# Patient Record
Sex: Female | Born: 2000 | Race: Black or African American | Hispanic: No | Marital: Single | Smoking: Never smoker
Health system: Southern US, Community
[De-identification: ages and names within clinical notes are randomized; demographics above are authoritative.]

## PROBLEM LIST (undated history)

## (undated) DIAGNOSIS — J45909 Unspecified asthma, uncomplicated: Secondary | ICD-10-CM

## (undated) DIAGNOSIS — L309 Dermatitis, unspecified: Secondary | ICD-10-CM

## (undated) HISTORY — PX: HERNIA REPAIR: SHX51

## (undated) HISTORY — PX: OTHER SURGICAL HISTORY: SHX169

---
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
## 2021-02-05 ENCOUNTER — Encounter: Payer: Medicaid Other | Attending: Pediatrics | Admitting: Registered"

## 2021-02-05 ENCOUNTER — Encounter: Payer: Self-pay | Admitting: Registered"

## 2021-02-05 DIAGNOSIS — R634 Abnormal weight loss: Secondary | ICD-10-CM | POA: Diagnosis not present

## 2021-02-05 NOTE — Progress Notes (Signed)
Medical Nutrition Therapy:  Appt start time: 1130 end time:  1212.  Assessment:  Primary concerns today: Pt referred due to weight loss.  Virtual Visit via Video Note  I connected with Tara Mckee on 02/05/21 at 11:30 AM EST by a video enabled telemedicine application and verified that I am speaking with the correct person using two identifiers.  Location: Patient: Dorm.  Provider: Office.   Pt reports primary concern is weight loss she has experienced since starting school this past year. Pt reports she doesn't think she has to gain the wt back but wants to gain back to previous wt. Reports last wt was 121 lb taken last month. Reports she feels she is eating the same amounts when she eats, however, reports eating less times each day. Pt reports this change in eating pattern is due to school related stress. Reports when stressed it reduces her appetite. Reports in past she would eat 3 meals and maybe 1 snack and now only eats 2 meals per day. Reports often skips breakfast. Reports sometimes does not eat if nothing in cafeteria she likes.   Pt attends [employer] in [place]. Pt has 8 AM classes 3 days per week and later starts other 2 days.   Pt denies changes in energy level, hair, nails. Denies any dizziness or headaches. Denies any GI issues. Pt reports having trouble sleeping, reports trouble falling asleep. Also reports not getting much sleep due to school work.   Pt reports depression. Reports having suicidal thoughts in past. Denies self harm or plans of self harm. Reports she talks with her mother if she has these thoughts. Pt reports her doctor offered a referral for counseling but she does not want to start counseling at this time. Reports she prefers to talk with her mother than a Veterinary surgeon.   Food Allergies/Intolerances: None reported.   GI Concerns: None reported.   Pertinent Lab Values: N/A  Weight Hx: See growth chart. Noted wt loss of 15 lb over past 10 months which  pt reports is unintentional.   Preferred Learning Style:   No preference indicated   Learning Readiness:   Ready  MEDICATIONS: See list. Reviewed.    DIETARY INTAKE:  Usual eating pattern includes 2 meals and 0 snacks per day.   Common foods: None reported.  Avoided foods: None reported.    Typical Snacks: chips.    Typical Beverages: Min[place] Maid fruit punch, soda, small amount water. Also likes chocolate milk. Pt likes yogurt, cheese only on hamburgers.   Location of Meals: dorm, cafeteria.   Electronics Present at [employer]: N/A  24-hr recall:  Woke at noon  B ( AM): Asleep  Snk ( AM): Asleep  L (12-1 PM): chips Snk ( PM): None reported.  D (4 PM): fries, philly cheese steak (cafeteria at school) Snk ( PM): None reported.  Beverages: Min[employer]id fruit punch, water   Usual physical activity: N/A Minutes/Week: N/A  Estimated energy needs (UBW used for calorie estimation: 136 lb):   1793 calories 202-291 g carbohydrates 53 g protein 50-70 g fat  Progress Towards Goal(s):  In progress.   Nutritional Diagnosis:  NI-1.4 Inadequate energy intake As related to skipping meals, increased stress/change in schedule, reduced appetite.  As evidenced by reported skipping meals since starting college; reported unintentional wt loss of 15 lb over past year.    Intervention:  Nutrition counseling provided. Dietitian provided education regarding importance of consistent eating pattern to meet nutritional needs and goals. Discussed strategies for  getting in 3 meals per day and 2 snacks to meet needs to help return to UBW. Discussed trying [employer] as snack 1 time daily and if unable to get a meal as plan B. Discussed whole food is preferred. Discussed adding multivitamin. Encouraged counseling to help with stress and reported depression. Praised pt for reaching out to mother when feeling depression. Provided number for suicide lifeline and discussed calling 911 if  ever having thoughts about hurting herself. Pt appeared agreeable to information/goals discussed.    Instructions/Goals:   Have 3 meals, 2 snacks per day.   Breakfast Ideas: protein + 2 energy foods  [place] yogurt + nuts + fruit   PB&J + fruit  Plan B if unable to get in solid foods: [employer] *May use as plan B if unable to get solid food at any meal  Snack Ideas:  Raw veggies + dip like ranch  Fruit + peanut butter  Greek yogurt   Recommend a complete multivitamin. May do Flintstones chewable Complete or Nature Made Complete.  National Suicide Prevention Lifeline: 1 6283412178 If you have harmed self or are thinking about harming yourself, call 911.  Recommend considering counseling services.   Teaching Method Utilized:  Visual Auditory  Barriers to learning/adherence to lifestyle change: stress   Demonstrated degree of understanding via:  Teach Back   Monitoring/Evaluation:  Dietary intake, exercise, and body weight in 1 month(s).

## 2021-02-06 NOTE — Patient Instructions (Signed)
Instructions/Goals:   Have 3 meals, 2 snacks per day.   Breakfast Ideas: protein + 2 energy foods  [place] yogurt + nuts + fruit   PB&J + fruit  Plan B if unable to get in solid foods: [employer] *May use as plan B if unable to get solid food at any meal  Snack Ideas:  Raw veggies + dip like ranch  Fruit + peanut butter  Greek yogurt   Recommend a complete multivitamin. May do Flintstones chewable Complete or Nature Made Complete.  National Suicide Prevention Lifeline: 1 220-084-8730 If you have harmed self or are thinking about harming yourself, call 911.  Recommend considering counseling services.

## 2021-04-05 ENCOUNTER — Encounter (INDEPENDENT_AMBULATORY_CARE_PROVIDER_SITE_OTHER): Payer: Self-pay

## 2021-04-15 DIAGNOSIS — R110 Nausea: Secondary | ICD-10-CM | POA: Diagnosis not present

## 2021-04-15 DIAGNOSIS — L0501 Pilonidal cyst with abscess: Secondary | ICD-10-CM | POA: Diagnosis not present

## 2021-04-23 ENCOUNTER — Encounter: Payer: Self-pay | Admitting: Pediatrics

## 2021-04-23 ENCOUNTER — Ambulatory Visit (INDEPENDENT_AMBULATORY_CARE_PROVIDER_SITE_OTHER): Payer: Medicaid Other | Admitting: Pediatrics

## 2021-04-23 ENCOUNTER — Other Ambulatory Visit (HOSPITAL_COMMUNITY)
Admission: RE | Admit: 2021-04-23 | Discharge: 2021-04-23 | Disposition: A | Discharge status: Home / Self Care | Payer: Medicaid Other | Source: Ambulatory Visit | Attending: Pediatrics | Admitting: Pediatrics

## 2021-04-23 VITALS — BP 100/68 | Ht 65.55 in | Wt 119.0 lb

## 2021-04-23 DIAGNOSIS — Z3202 Encounter for pregnancy test, result negative: Secondary | ICD-10-CM | POA: Diagnosis not present

## 2021-04-23 DIAGNOSIS — E639 Nutritional deficiency, unspecified: Secondary | ICD-10-CM

## 2021-04-23 DIAGNOSIS — Z113 Encounter for screening for infections with a predominantly sexual mode of transmission: Secondary | ICD-10-CM | POA: Insufficient documentation

## 2021-04-23 DIAGNOSIS — Z114 Encounter for screening for human immunodeficiency virus [HIV]: Secondary | ICD-10-CM

## 2021-04-23 DIAGNOSIS — L0231 Cutaneous abscess of buttock: Secondary | ICD-10-CM | POA: Diagnosis not present

## 2021-04-23 LAB — POCT RAPID HIV: Rapid HIV, POC: NEGATIVE

## 2021-04-23 LAB — POCT URINE PREGNANCY: Preg Test, Ur: NEGATIVE

## 2021-04-23 NOTE — Progress Notes (Signed)
Subjective:    Patient ID: Tara Mckee, female    DOB: [birth year], 20 y.o.   MRN: [id number]  HPI Tara Mckee is here to follow up after treatment for pilonidal abscess diagnosed 04/15/2021 at Urgent care.  1.  Record of visit and care is in EHR and reviewed by this physician.  Lesion was described as follows: "Location: Gluteal cleft 4 x 6 cm erythematous Lesion Fluctuant lesion warm to touch, tender to palpitation" Lesion ws drained in ED and Augmentin prescribed for a 76 day course. Tara Mckee states she has tolerated the medication well with no vomiting, diarrhea or vaginitis symptoms. States normal tolerance of activities but does get sore in that area if she sits a long time.  2.  Other concern today is sexual health. Last sexually active 1 month ago; has had 2 partners in the past 9 months of college and states she is no longer in a relationship. Condoms used but states recent partner's condom came off and she did not know it at the time.  No other form of contraception. Has had her regular menstrual period since the incident, LMP earlier in May and normal in character.  Tara Mckee is now enrolled at [employer]; states she left [employer] and [place] due to financial reasons. Grades are good. Reports not eating well because cafeteria has very limited selection and food is not good. Buys food off campus when she has extra money (not working now) and mom visits with frequency and takes her grocery shopping. 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  PMH, problem list, medications and allergies, family and social history reviewed and updated as indicated.  Review of Systems As noted in HPI above.    Objective:   Physical Exam Vitals and nursing note reviewed.  Constitutional:      General: She is not in acute distress.    Appearance: Normal appearance. She is normal weight. She is not ill-appearing.  Genitourinary:    Rectum:  Normal.     Comments: Palpation along gluteal cleft from base of sacrum to perineum with no pain or fluctuance.  No redness or abnormal findings. Neurological:     Mental Status: She is alert.    Blood pressure 100/68, height 5' 5.55" (1.665 m), weight 119 lb (54 kg). Wt Readings from Last 3 Encounters:  04/23/21 119 lb (54 kg) (36 %, Z= -0.36)*  12/29/20 120 lb 12.8 oz (54.8 kg) (41 %, Z= -0.22)*  06/14/20 132 lb 3.2 oz (60 kg) (65 %, Z= 0.39)*   * Growth percentiles are based on CDC (Girls, 2-20 Years) data.   Results for orders placed or performed in visit on 04/23/21 (from the past 48 hour(s))  POCT urine pregnancy     Status: Normal   Collection Time: 04/23/21  4:56 PM  Result Value Ref Range   Preg Test, Ur Negative Negative  POCT Rapid HIV     Status: Normal   Collection Time: 04/23/21  4:56 PM  Result Value Ref Range   Rapid HIV, POC Negative       Assessment & Plan:   1. Gluteal abscess Abscess is resolved on exam and by patient symptom report. Advised on hygiene and to call if symptoms return. She should complete her Augmentin as prescribed.  2. Negative pregnancy test Pregnancy test screening done due to lack of contraception.  Results were negative. Counseled on contraception - types, frequency of use and also used  Dealer. Tara Mckee voiced understanding of teaching but declined contraception today, stating she is not sexually active often and not ready to start contraception.  Provided printed information to review and reminded her she can call here or seek care local to her college if she decides to start contraception. - POCT urine pregnancy  3. Routine screening for STI (sexually transmitted infection) Screening done due to report of unreliable condom use. Informed her I will message her in MyChart with results. Condoms provided from office. - Urine cytology ancillary only - POCT Rapid HIV  4. Poor eating habits Offered some reassurance based on weight  stable these past months. Encouraged her to explore foods in cafeteria and eat BLD as much as possible; rely on supplemental foods she purchases herself. Should continue daily multivitamin with iron.  Greater than 50% of this 45 minute face to face encounter spent in counseling for presenting issues. Lurlean Leyden, MD

## 2021-04-23 NOTE — Patient Instructions (Addendum)
Abscess has resolved.  Finish the antibiotic and call if symptoms return  STI testing for chlamydia and gonorrhea  will be back on Tuesday or Wednesday. I will send you information in MyChart. Pregnancy test and HIV test NEGATIVE today.  Always use condom for STI protection. Let me know if you decide on contraception.  You can also get care at your school med center or local urgent care.  Take a daily multivitamin supplement like Women's One a Day  Contraception Choices Contraception refers to things you do or use to prevent pregnancy. It is also called birth control. There are several methods of birth control. Talk to your doctor about the best method for you. Hormonal birth control This kind of birth control uses hormones. Here are some types of hormonal birth control:  A tube that is put under the skin of your arm (implant). The tube can stay in for up to 3 years.  Shots you get every 3 months.  Pills you take every day.  A patch you change 1 time each week for 3 weeks. After that, the patch is taken off for 1 week.  A ring you put in the vagina. The ring is left in for 3 weeks. Then it is taken out of the vagina for 1 week. Then a new ring is put in.  Pills you take after unprotected sex. These are called emergency birth control pills.   Barrier birth control Here are some types of barrier birth control:  A thin covering that is put on the penis before sex (female condom). The covering is thrown away after sex.  A soft, loose covering that is put in the vagina before sex (female condom). The covering is thrown away after sex.  A rubber bowl that sits over the cervix (diaphragm). The bowl must be made for you. The bowl is put into the vagina before sex. The bowl is left in for 6-8 hours after sex. It is taken out within 24 hours.  A small, soft cup that fits over the cervix (cervical cap). The cup must be made for you. The cup should be left in for 6-8 hours after sex. It is taken  out within 48 hours.  A sponge that is put into the vagina before sex. It must be left in for at least 6 hours after sex. It must be taken out within 30 hours and thrown away.  A chemical that kills or stops sperm from getting into the womb (uterus). This chemical is called a spermicide. It may be a pill, cream, jelly, or foam to put in the vagina. The chemical should be used at least 10-15 minutes before sex.   IUD birth control IUD means "intrauterine device." It is put inside the womb. There are two kinds:  Hormone IUD. This kind can stay in the womb for 3-5 years.  Copper IUD. This kind can stay in the womb for 10 years. Permanent birth control Here are some types of permanent birth control:  Surgery to block the fallopian tubes.  Having an insert put into each fallopian tube. This method takes 3 months to work. Other forms of birth control must be used for 3 months.  Surgery to tie off the tubes that carry sperm in men (vasectomy). This method takes 3 months to work. Other forms of birth control must be used for 3 months. Natural planning birth control Here are some types of natural planning birth control:  Not having sex on the days  the woman could get pregnant.  Using a calendar: ? To keep track of the length of each menstrual cycle. ? To find out what days pregnancy can happen. ? To plan to not have sex on days when pregnancy can happen.  Watching for signs of ovulation and not having sex during this time. One way the woman can check for ovulation is to check her temperature.  Waiting to have sex until after ovulation. Where to find more information  Centers for Disease Control and Prevention: FootballExhibition.com.br Summary  Contraception, also called birth control, refers to things you do or use to prevent pregnancy.  Hormonal methods of birth control include implants, injections, pills, patches, vaginal rings, and emergency birth control pills.  Barrier methods of birth  control can include female condoms, female condoms, diaphragms, cervical caps, sponges, and spermicides.  There are two types of IUD (intrauterine device) birth control. An IUD can be put in a woman's womb to prevent pregnancy for several years.  Permanent birth control can be done through a procedure for males, females, or both. Natural planning means not having sex when the woman could get pregnant. This information is not intended to replace advice given to you by your health care provider. Make sure you discuss any questions you have with your health care provider. Document Revised: 04/24/2020 Document Reviewed: 04/24/2020 Elsevier Patient Education  2021 [employer].

## 2021-04-26 ENCOUNTER — Other Ambulatory Visit: Payer: Self-pay | Admitting: Pediatrics

## 2021-04-26 DIAGNOSIS — A5602 Chlamydial vulvovaginitis: Secondary | ICD-10-CM

## 2021-04-26 DIAGNOSIS — A5609 Other chlamydial infection of lower genitourinary tract: Secondary | ICD-10-CM

## 2021-04-26 DIAGNOSIS — N760 Acute vaginitis: Secondary | ICD-10-CM

## 2021-04-26 LAB — URINE CYTOLOGY ANCILLARY ONLY
Chlamydia: POSITIVE — AB
Comment: NEGATIVE
Comment: NORMAL
Neisseria Gonorrhea: NEGATIVE

## 2021-04-26 MED ORDER — DOXYCYCLINE HYCLATE 100 MG PO CAPS: ORAL_CAPSULE | ORAL | 0 refills | Status: DC

## 2021-04-26 MED ORDER — DOXYCYCLINE MONOHYDRATE 100 MG PO TABS: ORAL_TABLET | ORAL | 0 refills | Status: DC

## 2021-04-26 NOTE — Telephone Encounter (Signed)
I called and spoke with patient about test results and reporting. Answered her questions.  Elley states unable to contact her ex-partner. Will send prescription to her preferred pharmacy.  Doxycycline 100 mg bid x 7 days; follow up only if needed. Reporting form placed for faxing.

## 2021-04-26 NOTE — Telephone Encounter (Signed)
Changed to capsule.

## 2021-05-31 ENCOUNTER — Ambulatory Visit: Payer: Medicaid Other | Admitting: Pediatrics

## 2021-07-02 ENCOUNTER — Ambulatory Visit: Payer: Medicaid Other | Admitting: Pediatrics

## 2021-07-05 ENCOUNTER — Telehealth: Payer: Self-pay | Admitting: Licensed Clinical Social Worker

## 2021-07-05 ENCOUNTER — Ambulatory Visit (INDEPENDENT_AMBULATORY_CARE_PROVIDER_SITE_OTHER): Payer: Medicaid Other | Admitting: Pediatrics

## 2021-07-05 ENCOUNTER — Other Ambulatory Visit: Payer: Self-pay

## 2021-07-05 ENCOUNTER — Encounter: Payer: Self-pay | Admitting: Pediatrics

## 2021-07-05 ENCOUNTER — Other Ambulatory Visit (HOSPITAL_COMMUNITY)
Admission: RE | Admit: 2021-07-05 | Discharge: 2021-07-05 | Disposition: A | Discharge status: Home / Self Care | Payer: Medicaid Other | Source: Ambulatory Visit | Attending: Pediatrics | Admitting: Pediatrics

## 2021-07-05 VITALS — Wt 116.2 lb

## 2021-07-05 DIAGNOSIS — R4589 Other symptoms and signs involving emotional state: Secondary | ICD-10-CM | POA: Diagnosis not present

## 2021-07-05 DIAGNOSIS — R634 Abnormal weight loss: Secondary | ICD-10-CM | POA: Diagnosis not present

## 2021-07-05 DIAGNOSIS — R300 Dysuria: Secondary | ICD-10-CM

## 2021-07-05 DIAGNOSIS — G479 Sleep disorder, unspecified: Secondary | ICD-10-CM | POA: Diagnosis not present

## 2021-07-05 LAB — POCT URINALYSIS DIPSTICK
Bilirubin, UA: NEGATIVE
Glucose, UA: NEGATIVE
Ketones, UA: NEGATIVE
Leukocytes, UA: NEGATIVE
Nitrite, UA: NEGATIVE
Protein, UA: POSITIVE — AB
Spec Grav, UA: >=1.03 — AB (ref 1.010–1.025)
Urobilinogen, UA: 0.2 E.U./dL
pH, UA: 5 (ref 5.0–8.0)

## 2021-07-05 NOTE — Patient Instructions (Signed)
Websites for Teens  General www.youngwomenshealth.org www.youngmenshealthsite.org www.teenhealthfx.com www.teenhealth.org www.healthychildren.org  Sexual and Reproductive Health www.bedsider.org www.seventeendays.org www.plannedparenthood.org www.StrengthHappens.si www.girlology.com  Relaxation & Meditation Apps for Teens Mindshift StopBreatheThink Relax & Rest Smiling Mind Calm Headspace Take A Chill Kids Feeling SAM Freshmind Yoga By [employer]  Websites for kids with ADHD and their families www.smartkidswithld.org www.additudemag.com  Teens need about 9 hours of sleep a night. Younger children need more sleep (10-11 hours a night) and adults need slightly less (7-9 hours each night). 11 Tips to Follow: No caffeine after 3pm: Avoid beverages with caffeine (soda, tea, energy drinks, etc.) especially after 3pm.  Don't go to bed hungry: Have your evening meal at least 3 hrs. before going to sleep. It's fine to have a small bedtime snack such as a glass of milk and a few crackers but don't have a big meal.  Have a nightly routine before bed: Plan on "winding down" before you go to sleep. Begin relaxing about 1 hour before you go to bed. Try doing a quiet activity such as listening to calming music, reading a book or meditating.  Turn off the TV and ALL electronics including video games, tablets, laptops, etc. 1 hour before sleep, and keep them out of the bedroom.  Turn off your cell phone and all notifications (new email and text alerts) or even better, leave your phone outside your room while you sleep. Studies have shown that a part of your brain continues to respond to certain lights and sounds even while you're still asleep.  Make your bedroom quiet, dark and cool. If you can't control the noise, try wearing earplugs or using a fan to block out other sounds.  Practice relaxation techniques. Try reading a book or meditating or drain your brain by writing a list of  what you need to do the next day.  Don't nap unless you feel sick: you'll have a better night's sleep.  Don't smoke, or quit if you do. Nicotine, alcohol, and marijuana can all keep you awake. Talk to your health care provider if you need help with substance use.  Most importantly, wake up at the same time every day (or within 1 hour of your usual wake up time) EVEN on the weekends. A regular wake up time promotes sleep hygiene and prevents sleep problems.  Reduce exposure to bright light in the last three hours of the day before going to sleep.  Maintaining good sleep hygiene and having good sleep habits lower your risk of developing sleep problems. Getting better sleep can also improve your concentration and alertness. Try the simple steps in this guide. If you still have trouble getting enough rest, make an appointment with your health care provider.

## 2021-07-05 NOTE — Telephone Encounter (Signed)
Called to schedule bh appointment. Left voicemail with contact information and requested call back to [phone]

## 2021-07-05 NOTE — Progress Notes (Signed)
History was provided by the patient.  Personal phone number: [phone]  Tara Mckee is a 20 y.o. female who is here for follow-up of multiple issues including dysuria, weight loss, sleep, and mood.   HPI:  weight loss: 132->116 in 1 year; was 120 lb in January 2022 to 116 lb today) feels like she is eating a lot but still losing weight. Eats breakfast at cafeteria. Lunch at cafeteria or out and about.  - Breakfast: 3 times weekly  - Lunch: daily at cafeteria  - Dinner: daily or at [employer]  - has meal plan; in her room keeps mostly junk food (chips); tries yogurt but doesn't eat them  2. dysuria - since taking the medicine for chlamydia (04/26/21), had return of burning sensation with urination, increased odor. No discharge, no blood. Has not been sexually active since. No rash or lesions.  3. Sleep and mood concerns: - sleep poor: takes hours to fall asleep and needs "Sleep Aid" or Benadryl; over-thinking - At college at [employer]: drama, had stopped going to class, feeling sad daily, unable to get up to class - Mood: rough summer, mom w/health problems (seizure disorder) worsening. Feels sad every day, impacts ability to participate in daily life, feelings of guilt, poor sleep appetite loss/weight loss. Multiple times weekly thoughts of not wanting to be around. Endorses suicide intent "tried to overdose on Sleep Aid" in effort "not to be around" back in march 2022. Doesn't have anyone to talk to as she doesn't want to burden mom, was close to grandmother who passed away 1.5 years ago. No other close family or friends. No current plan to harm self, no current thoughts of self harm. Confirmed she has [facility] number to call if mood worsening, or go to ED/Urgent Care. Warm hand-ff to [facility] conducted by Dr. Duffy Rhody.   The following portions of the patient's history were reviewed and updated as appropriate: allergies, current medications, past medical history, and problem  list.  Physical Exam:  Wt 116 lb 3.2 oz (52.7 kg)   BMI 19.01 kg/m   Blood pressure percentiles are not available for patients who are 18 years or older.  No LMP recorded.    General:   alert, cooperative, and no distress     Skin:   normal and no rash  Oral cavity:   lips, mucosa, and tongue normal; teeth and gums normal  Eyes:   sclerae white  Ears:    Normal external ears  Nose: clear, no discharge  Neck:  Supple, no cervical adenopathy  Lungs:  clear to auscultation bilaterally and normal WOB  Heart:   regular rate and rhythm, S1, S2 normal, no murmur, click, rub or gallop   Abdomen:   Soft, non-tender, non-distended, +BS  GU:   Declined. Patient denies discharge or lesions  Extremities:   extremities normal, atraumatic, no cyanosis or edema  Neuro:  normal without focal findings and depressed affect    Assessment/Plan:  19 year old in follow-up for dysuria, found to have continued weight loss and sleep/mood concerns concerning for depression with admitted suicidal attempt months ago (March 2022). No acute danger to self or others, and warm hand-off to [facility] today. She will benefit from mental health support as family is going through a challenging time.  1. Dysuria - POCT urinalysis dipstick: neg leuk/nitrite - Urine cytology ancillary only: pending  2. Sleep difficulties - Amb ref to [facility]  3. Depressed mood secondary to family circumstance - Amb ref to  [facility], warm hand-off today - safe to self and others currently but will benefit from coping strategies/support - Follow-up with [facility] set for 8/19  4. Weight loss due to inadequate caloric intake - likely secondary to the above - no other systemic symptoms  - no food insecurity (has meal plan at college, plenty of food at home) - discussed breakfast and snacks on hand with protein - peanut butter, protein bars, yogurt, nuts, hard-boiled eggs, etc.  Follow-up visit in 2  weeks joint with PCP Duffy Rhody and [facility]   Marita [place], MD  07/05/21

## 2021-07-06 LAB — URINE CYTOLOGY ANCILLARY ONLY
Chlamydia: NEGATIVE
Comment: NEGATIVE
Comment: NORMAL
Neisseria Gonorrhea: NEGATIVE

## 2021-07-19 ENCOUNTER — Ambulatory Visit: Payer: Medicaid Other | Admitting: Pediatrics

## 2021-07-20 ENCOUNTER — Ambulatory Visit: Payer: Medicaid Other | Admitting: Licensed Clinical Social Worker

## 2021-07-20 NOTE — BH Specialist Note (Signed)
Connected to appointment and texted link. Called patient to offer assistance with logging on. Patient asked to reschedule appointment. BH consult rescheduled for 9/8 at 4:30 pm virtually.

## 2021-08-09 ENCOUNTER — Ambulatory Visit: Payer: Medicaid Other | Admitting: Licensed Clinical Social Worker

## 2021-08-09 NOTE — BH Specialist Note (Signed)
No show for virtual appointment. Sent link and remained connected to visit for 16 minutes. LVM for patient requesting call back to [phone] for help connecting or to reschedule.

## 2021-08-16 ENCOUNTER — Ambulatory Visit: Payer: Medicaid Other | Admitting: Pediatrics

## 2021-12-05 ENCOUNTER — Ambulatory Visit: Payer: Medicaid Other | Admitting: Pediatrics

## 2022-02-14 ENCOUNTER — Ambulatory Visit (INDEPENDENT_AMBULATORY_CARE_PROVIDER_SITE_OTHER): Payer: Medicaid Other | Admitting: Pediatrics

## 2022-02-14 ENCOUNTER — Other Ambulatory Visit (HOSPITAL_COMMUNITY)
Admission: RE | Admit: 2022-02-14 | Discharge: 2022-02-14 | Disposition: A | Discharge status: Home / Self Care | Payer: Medicaid Other | Source: Ambulatory Visit | Attending: Pediatrics | Admitting: Pediatrics

## 2022-02-14 VITALS — BP 116/74 | Ht 66.14 in | Wt 121.2 lb

## 2022-02-14 DIAGNOSIS — Z113 Encounter for screening for infections with a predominantly sexual mode of transmission: Secondary | ICD-10-CM | POA: Insufficient documentation

## 2022-02-14 DIAGNOSIS — L2082 Flexural eczema: Secondary | ICD-10-CM

## 2022-02-14 MED ORDER — TRIAMCINOLONE ACETONIDE 0.1 % EX CREA: TOPICAL_CREAM | CUTANEOUS | 2 refills | Status: DC

## 2022-02-14 NOTE — Patient Instructions (Signed)
I will call you with the test results or send to you in MyChart. ?

## 2022-02-14 NOTE — Progress Notes (Signed)
? ?  Subjective:  ? ? Patient ID: Tara Mckee, female    DOB: 2001-01-14, 21 y.o.   MRN: [id number] ? ?HPI ?Chief Complaint  ?Patient presents with  ? Follow-up  ?  ?Tara Mckee is here with several concerns today.  She is accompanied by a friend from college. ?States problem with eczema, especially arms and legs.  Using moisturizer and would like refill of her steroid cream. ?Some nasal symptoms related to seasonal allergies. ? ?2.  States her urine has a different smell and would like this checked.  No vaginal discharge or itching noted. ?No menstrual problems. ?Last sex with female 1 month ago and only 1 partner in 6 months ? ?Otherwise doing well. ? ?PMH, problem list, medications and allergies, family and social history reviewed and updated as indicated.  ? ?Review of Systems ?As noted in HPI above. ?   ?Objective:  ? Physical Exam ?Vitals and nursing note reviewed.  ?Constitutional:   ?   General: She is not in acute distress. ?   Appearance: Normal appearance. She is normal weight.  ?Cardiovascular:  ?   Rate and Rhythm: Normal rate and regular rhythm.  ?   Pulses: Normal pulses.  ?   Heart sounds: Normal heart sounds.  ?Pulmonary:  ?   Effort: Pulmonary effort is normal.  ?   Breath sounds: Normal breath sounds.  ?Abdominal:  ?   Palpations: Abdomen is soft.  ?   Tenderness: There is no abdominal tenderness.  ?Skin: ?   General: Skin is warm and dry.  ?   Capillary Refill: Capillary refill takes less than 2 seconds.  ?   Comments: Hyperpigmented eczematoid change at antecubital and popliteal fossae; no breaks in skin or molluscum  ?Neurological:  ?   General: No focal deficit present.  ?   Mental Status: She is alert.  ?Psychiatric:     ?   Mood and Affect: Mood normal.     ?   Behavior: Behavior normal.  ? ?Blood pressure 116/74, height 5' 6.14" (1.68 m), weight 121 lb 3.2 oz (55 kg).  ?   ?Assessment & Plan:  ?1. Flexural eczema ?Chronic, recurring problem. ?Advised on continued use of mild cleansers and moisturizer.   Sent refill of triamcinolone to her preferred pharmacy. ?- triamcinolone cream (KENALOG) 0.1 %; Apply to areas of eczema on body twice a day when needed; do not use on face  Dispense: 45 g; Refill: 2 ? ?2. Routine screening for STI (sexually transmitted infection) ?Discussed screening sent for STI and vaginitis.  Will contact patient with results and treat as indicate. ?- Urine cytology ancillary only ?- WET PREP BY MOLECULAR PROBE  ? ?Azalynn voiced understanding and agreement with plan of care. ?Maree Erie, MD  ? ?Addendum:  labs resulted with G vaginalis noted.  Discussed with patient and treatment sent to her pharmacy on 02/15/2022. ?Maree Erie, MD  ?

## 2022-02-15 ENCOUNTER — Encounter: Payer: Self-pay | Admitting: Pediatrics

## 2022-02-15 DIAGNOSIS — B9689 Other specified bacterial agents as the cause of diseases classified elsewhere: Secondary | ICD-10-CM

## 2022-02-15 LAB — WET PREP BY MOLECULAR PROBE
Candida species: NOT DETECTED
MICRO NUMBER:: 13140832
SPECIMEN QUALITY:: ADEQUATE
Trichomonas vaginosis: NOT DETECTED

## 2022-02-15 LAB — URINE CYTOLOGY ANCILLARY ONLY
Chlamydia: NEGATIVE
Comment: NEGATIVE
Comment: NORMAL
Neisseria Gonorrhea: NEGATIVE

## 2022-02-15 MED ORDER — METRONIDAZOLE 500 MG PO TABS: ORAL_TABLET | ORAL | 0 refills | Status: DC

## 2022-02-18 ENCOUNTER — Encounter: Payer: Self-pay | Admitting: Pediatrics

## 2022-02-28 ENCOUNTER — Encounter: Payer: Self-pay | Admitting: Pediatrics

## 2022-03-04 ENCOUNTER — Ambulatory Visit (INDEPENDENT_AMBULATORY_CARE_PROVIDER_SITE_OTHER): Payer: Medicaid Other | Admitting: Pediatrics

## 2022-03-04 ENCOUNTER — Encounter: Payer: Self-pay | Admitting: Pediatrics

## 2022-03-04 ENCOUNTER — Other Ambulatory Visit (HOSPITAL_COMMUNITY)
Admission: RE | Admit: 2022-03-04 | Discharge: 2022-03-04 | Disposition: A | Discharge status: Home / Self Care | Payer: Medicaid Other | Source: Ambulatory Visit | Attending: Pediatrics | Admitting: Pediatrics

## 2022-03-04 VITALS — Temp 98.1°F | Wt 121.2 lb

## 2022-03-04 DIAGNOSIS — Z3202 Encounter for pregnancy test, result negative: Secondary | ICD-10-CM | POA: Diagnosis not present

## 2022-03-04 DIAGNOSIS — N898 Other specified noninflammatory disorders of vagina: Secondary | ICD-10-CM

## 2022-03-04 DIAGNOSIS — Z113 Encounter for screening for infections with a predominantly sexual mode of transmission: Secondary | ICD-10-CM | POA: Diagnosis not present

## 2022-03-04 DIAGNOSIS — B3731 Acute candidiasis of vulva and vagina: Secondary | ICD-10-CM

## 2022-03-04 DIAGNOSIS — Z7251 High risk heterosexual behavior: Secondary | ICD-10-CM | POA: Insufficient documentation

## 2022-03-04 LAB — POCT URINALYSIS DIPSTICK
Bilirubin, UA: NEGATIVE
Blood, UA: 50
Glucose, UA: NEGATIVE
Ketones, UA: NEGATIVE
Nitrite, UA: POSITIVE
Protein, UA: POSITIVE — AB
Spec Grav, UA: 1.01 (ref 1.010–1.025)
Urobilinogen, UA: NEGATIVE E.U./dL — AB
pH, UA: 8.5 — AB (ref 5.0–8.0)

## 2022-03-04 LAB — POCT URINE PREGNANCY: Preg Test, Ur: NEGATIVE

## 2022-03-04 MED ORDER — FLUCONAZOLE 150 MG PO TABS: ORAL_TABLET | ORAL | 0 refills | Status: DC

## 2022-03-04 NOTE — Patient Instructions (Signed)
I have send a prescription for diflucan to your pharmacy to treat for yeast infection. You will take the first dose today. If your symptoms are not improved in 2-3 days then take the second dose. ? ?We will call if results of your testing are positive. If your symptoms do not improve with treatment, please call our office. ? ? ?Call the main number [phone] before going to the Emergency Department unless it's a true emergency.  For a true emergency, go to the [facility] Emergency Department.  ? ?When the clinic is closed, a nurse always answers the main number [phone] and a doctor is always available. ?   ?Clinic is open for sick visits only on Saturday mornings from 8:30AM to 12:30PM.   Call first thing on Saturday morning for an appointment.   ?

## 2022-03-04 NOTE — Progress Notes (Signed)
? ?Subjective:  ? ?  ?Tara Mckee, is a 21 y.o. female ?  ?History provider by patient ?No interpreter necessary. ? ?Chief Complaint  ?Patient presents with  ? vaginal issues  ?  She said she is had intercourse after medication.  ? ? ?HPI:  ?- Seen in the office on 3/16 and wet prep was positive for G vaginalis. Treated with metronidazole starting 3/17. Completed a full two week course.  ?- Patient reports that the abnormal urine smell at previous visit resolved after treatment ?- She had intercourse after treatment- developed soreness and felt swollen about a day later. She is still having some pain with urination. ?- Used protection with intercourse  ?- She is now having vaginal discharge that is white with some itching ?- Denies abnormal smell, urinary frequency or urgency, dysuria, fevers, abdominal pain ?- She is not on birth control but regularly uses condoms ? ? ?Patient's history was reviewed and updated as appropriate: allergies, current medications, past family history, past medical history, past social history, past surgical history, and problem list. ? ?   ?Objective:  ?  ? ?Temp 98.1 ?F (36.7 ?C) (Oral)   Wt 121 lb 3.2 oz (55 kg)   BMI 19.48 kg/m?  ? ?Physical Exam ?Exam conducted with a chaperone present.  ?Constitutional:   ?   General: She is not in acute distress. ?   Appearance: Normal appearance. She is normal weight.  ?HENT:  ?   Head: Normocephalic and atraumatic.  ?   Mouth/Throat:  ?   Mouth: Mucous membranes are moist.  ?   Pharynx: Oropharynx is clear.  ?Eyes:  ?   Extraocular Movements: Extraocular movements intact.  ?Genitourinary: ?   General: Normal vulva.  ?   Comments: No erythema or discharge appreciated on external exam. No vaginal tenderness appreciated on exam. ?Skin: ?   General: Skin is warm and dry.  ?Neurological:  ?   General: No focal deficit present.  ?   Mental Status: She is alert.  ?Psychiatric:     ?   Mood and Affect: Mood normal.     ?   Behavior: Behavior normal.   ? ? ?   ?Assessment & Plan:  ? ?1. Yeast infection of the vagina ?2. Vaginal discharge ?3. Sexually active child ?4. Routine screening for STI (sexually transmitted infection) ?Tara Mckee presents for vaginal discharge and itching. She was recently seen and treated for bacterial vaginosis which she completed treatment for with resolution of symptoms. She has also previously had chlamydia infection. Today she describes new onset of clumpy white vaginal discharge with itching. Denies dysuria, fevers, abdominal pain. Her exam is unremarkable without vaginal discharge or tenderness appreciated. Vaginal exam with wet prep swab was completed. She has been using condoms regularly with intercourse. Wet prep, UA, upreg, and gc/chlamydia testing obtained. Her symptoms are most consistent with a vaginal yeast infection. We will empirically treat with diflucan and follow up test results. Pregnancy test negative. UA was positive but it was a dirty collection. She is not having symptoms consistent with a UTI so will not treat at this but will recollect if continues to have symptoms after treatment. Will follow up with any positive test results. We also discussed birth control options. She is interested in depo but would like it cause weight gain. She will call if she decides upon a birth control option. ?- fluconazole (DIFLUCAN) 150 MG tablet; Take one tablet and if symptoms not better in 2-3 days take the  second tablet  Dispense: 2 tablet; Refill: 0 ?- C. trachomatis/N. gonorrhoeae RNA ?- POCT urinalysis dipstick ?- WET PREP BY MOLECULAR PROBE ?- POCT urine pregnancy ? ?Supportive care and return precautions reviewed. ? ?Return if symptoms worsen or fail to improve. ? ?Tara Hickman, MD ? ? ? ?

## 2022-03-05 LAB — URINE CYTOLOGY ANCILLARY ONLY
Chlamydia: NEGATIVE
Comment: NEGATIVE
Comment: NORMAL
Neisseria Gonorrhea: NEGATIVE

## 2022-03-05 LAB — WET PREP BY MOLECULAR PROBE
Candida species: DETECTED — AB
Gardnerella vaginalis: NOT DETECTED
MICRO NUMBER:: 13215447
SPECIMEN QUALITY:: ADEQUATE
Trichomonas vaginosis: NOT DETECTED

## 2022-03-05 NOTE — Progress Notes (Signed)
Patient has already viewed her test results this afternoon via mychart. Sent mychart message to patient regarding results. She was prescribed appropriate treatment for candida in clinic yesterday.

## 2022-05-22 ENCOUNTER — Ambulatory Visit: Payer: Medicaid Other | Admitting: Pediatrics

## 2022-06-05 ENCOUNTER — Ambulatory Visit (INDEPENDENT_AMBULATORY_CARE_PROVIDER_SITE_OTHER): Payer: Medicaid Other | Admitting: Pediatrics

## 2022-06-05 ENCOUNTER — Other Ambulatory Visit (HOSPITAL_COMMUNITY)
Admission: RE | Admit: 2022-06-05 | Discharge: 2022-06-05 | Disposition: A | Discharge status: Home / Self Care | Payer: Medicaid Other | Source: Ambulatory Visit | Attending: Pediatrics | Admitting: Pediatrics

## 2022-06-05 ENCOUNTER — Encounter: Payer: Self-pay | Admitting: Pediatrics

## 2022-06-05 VITALS — BP 102/70 | HR 96 | Ht 65.55 in | Wt 123.2 lb

## 2022-06-05 DIAGNOSIS — Z114 Encounter for screening for human immunodeficiency virus [HIV]: Secondary | ICD-10-CM

## 2022-06-05 DIAGNOSIS — M549 Dorsalgia, unspecified: Secondary | ICD-10-CM

## 2022-06-05 DIAGNOSIS — G8929 Other chronic pain: Secondary | ICD-10-CM

## 2022-06-05 DIAGNOSIS — Z7187 Encounter for pediatric-to-adult transition counseling: Secondary | ICD-10-CM

## 2022-06-05 DIAGNOSIS — L309 Dermatitis, unspecified: Secondary | ICD-10-CM

## 2022-06-05 DIAGNOSIS — F419 Anxiety disorder, unspecified: Secondary | ICD-10-CM | POA: Diagnosis not present

## 2022-06-05 DIAGNOSIS — Z3202 Encounter for pregnancy test, result negative: Secondary | ICD-10-CM | POA: Diagnosis not present

## 2022-06-05 DIAGNOSIS — Z113 Encounter for screening for infections with a predominantly sexual mode of transmission: Secondary | ICD-10-CM

## 2022-06-05 DIAGNOSIS — Z3042 Encounter for surveillance of injectable contraceptive: Secondary | ICD-10-CM | POA: Diagnosis not present

## 2022-06-05 DIAGNOSIS — Z68.41 Body mass index (BMI) pediatric, 5th percentile to less than 85th percentile for age: Secondary | ICD-10-CM | POA: Diagnosis not present

## 2022-06-05 DIAGNOSIS — Z0000 Encounter for general adult medical examination without abnormal findings: Secondary | ICD-10-CM | POA: Diagnosis not present

## 2022-06-05 DIAGNOSIS — E639 Nutritional deficiency, unspecified: Secondary | ICD-10-CM | POA: Diagnosis not present

## 2022-06-05 LAB — POCT RAPID HIV: Rapid HIV, POC: NEGATIVE

## 2022-06-05 LAB — POCT URINE PREGNANCY: Preg Test, Ur: NEGATIVE

## 2022-06-05 MED ORDER — MEDROXYPROGESTERONE ACETATE 150 MG/ML IM SUSP
150.0000 mg | Freq: Once | INTRAMUSCULAR | Status: AC
  Administered 2022-06-05: 150 mg via INTRAMUSCULAR

## 2022-06-05 NOTE — Progress Notes (Signed)
Adolescent Well Care Visit Tara Mckee is a 21 y.o. female who is here for well care.    PCP:  Maree Erie, MD   History was provided by the patient.  Confidentiality was discussed with the patient and, if applicable, with caregiver as well. Patient's personal or confidential phone number: [phone]   Current Issues: Current concerns include the following: Would like to see dermatologist again about rash around her mouth; licks her lips and area gets dry and irritated, leading to more licking..   2.   Still has some urinary changes - feels incomplete emptying or frequency since STI but no other problems.       Uses [employer] for cleansing and showers. 3.   Concerned she needs help maintaining healthy weight. 4.   Low back pain on both sides.  Does not remember any injury and not doing heavy lifting.  No meds or other        current modifying factors.  Nutrition: Nutrition/Eating Behaviors: not eating as well since working - snacks (chips, cookies) and then dinner Adequate calcium in diet?: cheese and yogurt Supplements/ Vitamins: One a day women's  Exercise/ Media: Play any Sports?/ Exercise: walking at work over her  4 to 5 hours shift - this is an improvement Screen Time:  > 2 hours-counseling provided Media Rules or Monitoring?: no  Sleep:  Sleep: Not sleeping well.  Asleep around midnight then up 10 /11 am; wakes up during the night and not able to get back to sleep for a while.  New job will require her to sleep during the day and she thinks she will adjust to this fine. 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  no Stressors of note: university life was stressful, so she decided to move back home  Education: School Name: plans to start at [employer]  or other local school  Completed 4 semesters of credit at [employer] level   Menstruation:   Patient's last menstrual period was 05/28/2022 (approximate). Menstrual History: LMP 1 week ago and lasted 4 to 5 days Sexually active last in May/June - no longer with that partner   Confidential Social History: Tobacco?  Vapes daily Secondhand smoke exposure?  yes Drugs/ETOH?  no  Sexually Active?  yes   Pregnancy Prevention: condom  Safe at home, in school & in relationships?  Yes Safe to self?  Yes   Screenings: Patient has a dental home: no - last went for routine dental care a couple of years ago Has orthodontist and went for visit a couple of months ago; should have braces off soon.  The patient completed the Rapid Assessment for Adolescent Preventive Services screening questionnaire and the following topics were identified as risk factors and discussed: vaping, sexual activity. In addition, the following topics were discussed as part of anticipatory guidance healthy eating, condom use, birth control, mental health issues, and sleep .  PHQ-9 completed and results indicated low risk with score of 3 for sleep; no self-harm ideation.  The patient completed the Transition Skills Assessment for Young Adults screening questionnaire and the following topics were identified as learning needs and discussed:  no gaps in learning noted except how to transition to adult medicine and how to get referrals.   Physical Exam:  Vitals:   06/05/22 1554  BP: 102/70  Pulse: 96  SpO2: 99%  Weight: 123 lb 3.2 oz (55.9 kg)  Height: 5' 5.55" (1.665 m)   Wt Readings from Last 3 Encounters:  06/05/22 123 lb 3.2 oz (55.9 kg) (40 %, Z= -0.25)*  03/04/22 121 lb 3.2 oz (55 kg) (37 %, Z= -0.34)*  02/14/22 121 lb 3.2 oz (55 kg) (37 %, Z= -0.33)*   * Growth percentiles are based on CDC (Girls, 2-20 Years) data.    BP 102/70 (BP Location: Right Arm, Patient Position: Sitting, Cuff Size: Normal)   Pulse 96    Ht 5' 5.55" (1.665 m)   Wt 123 lb 3.2 oz (55.9 kg)   LMP 05/28/2022 (Approximate)   SpO2 99%   BMI 20.16 kg/m  Body mass index: body mass index is 20.16 kg/m. Blood pressure %iles are not available for patients who are 18 years or older.  Hearing Screening  Method: Audiometry   500Hz  1000Hz  2000Hz  4000Hz   Right ear 20 20 20 20   Left ear 20 20 20 20    Vision Screening   Right eye Left eye Both eyes  Without correction 20/16 20/16 20/16   With correction       General Appearance:   alert, oriented, no acute distress and well nourished  HENT: Normocephalic, no obvious abnormality, conjunctiva clear  Mouth:   Normal appearing teeth, no obvious discoloration, dental caries, or dental caps  Neck:   Supple; thyroid: no enlargement, symmetric, no tenderness/mass/nodules  Chest Normal female  Lungs:   Clear to auscultation bilaterally, normal work of breathing  Heart:   Regular rate and rhythm, S1 and S2 normal, no murmurs;   Abdomen:   Soft, non-tender, no mass, or organomegaly  GU normal female external genitalia, pelvic not performed.  Thin mucoid discharge  Musculoskeletal:   Tone and strength strong and symmetrical, all extremities               Lymphatic:   No cervical adenopathy  Skin/Hair/Nails:   Skin warm, dry and intact, no rashes, no bruises or petechiae.  Tattoo to right and left (fresh today) sides of neck  Neurologic:   Strength, gait, and coordination normal and age-appropriate     Assessment and Plan:   1. Encounter for general adult medical examination without abnormal findings Overall healthy young female. Hearing screening result:normal Vision screening result: normal Age appropriate anticipatory guidance provided.  2. Body mass index, pediatric, 5th percentile to less than 85th percentile for age BMI is appropriate for age; reviewed with Vitalia and encouraged more healthful eating habits.  3. Screening examination for venereal disease Routine screening  for HIV and STI; all returned negative. - POCT Rapid HIV - Urine cytology ancillary only  4. Poor eating habits Counseled on improved eating habits for adequate nutrients.  Advised to continue her supplement and referred to nutrition for guidance as she further gains responsibility for her own meals. - Amb ref to Medical Nutrition Therapy-MNT  5. Counseling for transition from pediatric to adult care provider Armanie demonstrated appropriate knowledge of her medical care for transition.  Reviewed tool with her and options. Provided information on adult medical and dental providers for her to get established  Pt chose family medicine; referral placed and advised her to call to establish care by the fall.  6. Urine pregnancy test negative Negative pregnancy test as required to proceed with contraception. - POCT urine pregnancy  7. Depot contraception Counseled on contraception; Lashunta chose to  proceed with injection of depo-provera. No contraindications to administration.  Tolerated with no adverse effect. Scheduled return in 3 months. - medroxyPROGESTERone (DEPO-PROVERA) injection 150 mg  8. Anxious mood Keta states anxiety related to challenges of college; sleep difficulties and had started vaping as means of self-medication.  I encouraged her to meet with [facility] for better awareness, sleep hygiene around her new work schedule and management of general anxious mood.  She agreed to referral. - Amb ref to Integrated Behavioral Health  9. Chronic bilateral back pain, unspecified back location No history of injury.  No signs of injury or muscle spasm. 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 Follow up as needed. 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  Would like to see Derm again but not able to travel to  [facility]; asks for local office. - Ambulatory referral to Dermatology    Advise annual flu vaccine and annual wellness visit. Maree Erie, MD

## 2022-06-05 NOTE — Patient Instructions (Addendum)
You will get a call about your appointments with the following:  1.  Dermatology  2.  Physical Therapy (back pain)  3.  Nutrition  Please contact Family medicine to schedule an appointment to establish care - that way your record will follow you in Epic  I will contact you about your urine test results.  CONGRATULATIONS!  You are now ready to leave the world of pediatric medicine and receive care in the field of adult medicine.  This includes your annual wellness visits, specialty care and vaccines. Please keep a copy of your insurance card on your phone.  Here are resources to help you transition from pediatric medicine to adult medicine for individuals covered by [facility] Medicaid. For private insurance:  Please check with your insurance provider for a list of participants. I advise you to check with these providers within the next couple of weeks because it may take a while for you to be seen by them for an initial visit. We will follow up with you in the next 30 days to see if you have accomplished this.  We will continue to see you for acute care and contraceptive care only until you are established with your new provider.  You can also get your annual FLU vaccine and get COVID vaccine at your local pharmacy including [facility], [facility], [facility] and many others.   For general medicine:   A.  [facility]       Address: [address], [place], [place] [address]                   Phone: [phone]  B.  [facility]                   Address: [address] [place], [place], [place] [address]                   Phone: [phone]   C.  [facility]       Address: Ground Floor - [facility]       [address]                         [place], [place] [address]                  Phone: [phone]  OR go to [email or web address] and FIND A DOCTOR    2.  For general dental care:   A.  [facility]         [address]        [place], [place]  [address]         Phone:  [phone]   Fax:  [phone]        Bus Line:  [address]        Office hours:  Monday - Thursday 9 am to 3 pm    B.  [facility]       [phone]       [place], [place] [address]       Phone:  [phone]      Office Hours:  Monday - Thursday 8:30 am to 5 pm       Friday 8:30 am to 2 pm  C. [facility]      [address]      [address]  [place], [place] [address]       Phone: [phone]  You can also search online and call the individual provider to see it your insurance is accepted.  3.  For general vision care we have numerous optometrists in [place] listed online as accepting [place] Medicaid.  Please check online and contact the office directly due to variations in different Medicaid plans. Here is a sampling:  Accepts Medicaid for Eye Exam and Glasses   [facility] [address] Phone: [phone]  Open Monday- Saturday from 9 AM to 5 PM Ages 6 months and older Se habla Espaol [facility] [address] [place] Phone: [phone] Open Monday -Friday (by appointment only) Ages 58 and older No se habla Espaol   [facility] [address] [place], [address] Phone: [phone] Open Monday-Saturday Ages 8 years and older Se habla Espaol  [facility] [place] [phone] [place], [place]  Phone: [phone] Open Monday-Friday Ages 5 years and older  Se habla Espaol   [facility] [place] [address] Phone: [phone] Open Monday-Friday Ages 5 and older No se habla Espaol  [facility] [place] [phone] [address] Phone: [phone] Age 8 year old and older Open Monday-Saturday Se habla Espaol  [facility] at [facility] [address] Phone: [phone] Open  Monday-Friday Ages 66 and older No se habla Espaol  [facility] [address], [place], [place] [address] Phone: [phone] Open Mon-Sat 10am-6pm Minimum age: 31 years No se habla [facility] [address] [place], [place] [address] Phone: [phone] Open Mon 1pm-7pm, Tue-Thur 8am-5:30pm, Fri 8am-1pm Minimum age: 47 years No se habla Espaol         Accepts Medicaid for Eye Exam only (will have to pay for glasses)   [facility] [address] Phone: [phone] Open 7 days per week Ages 5 and older (must know alphabet) No se habla Espaol  [facility] [address] [facility]  Phone: [phone] Open 7 days per week Ages 66 and older (must know alphabet) No se habla [facility] [address] [place], [address] Phone: [phone] Open Monday-Saturday Ages 6 years and older Se habla Espaol  [facility] [address] [place] Phone: [phone] Open 7 days per week Ages 5 and older (must know alphabet) No se habla Espaol    Please let us know if you need referrals for any other specific needs. We are happy to help you with this process!

## 2022-06-07 LAB — URINE CYTOLOGY ANCILLARY ONLY
Chlamydia: NEGATIVE
Comment: NEGATIVE
Comment: NORMAL
Neisseria Gonorrhea: NEGATIVE

## 2022-06-18 ENCOUNTER — Encounter: Payer: Self-pay | Admitting: *Deleted
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
## 2022-06-19 ENCOUNTER — Ambulatory Visit: Payer: Medicaid Other | Attending: Pediatrics

## 2022-06-19 DIAGNOSIS — M6281 Muscle weakness (generalized): Secondary | ICD-10-CM | POA: Insufficient documentation

## 2022-06-19 DIAGNOSIS — M5459 Other low back pain: Secondary | ICD-10-CM | POA: Insufficient documentation

## 2022-06-19 DIAGNOSIS — G8929 Other chronic pain: Secondary | ICD-10-CM | POA: Diagnosis not present

## 2022-06-19 DIAGNOSIS — M549 Dorsalgia, unspecified: Secondary | ICD-10-CM | POA: Insufficient documentation

## 2022-06-22 ENCOUNTER — Encounter (HOSPITAL_BASED_OUTPATIENT_CLINIC_OR_DEPARTMENT_OTHER): Payer: Self-pay | Admitting: Emergency Medicine

## 2022-06-22 ENCOUNTER — Emergency Department (HOSPITAL_BASED_OUTPATIENT_CLINIC_OR_DEPARTMENT_OTHER)
Admission: EM | Admit: 2022-06-22 | Discharge: 2022-06-22 | Discharge status: Against Medical Advice | Payer: Medicaid Other | Attending: Emergency Medicine | Admitting: Emergency Medicine

## 2022-06-22 ENCOUNTER — Other Ambulatory Visit: Payer: Self-pay

## 2022-06-22 DIAGNOSIS — H5789 Other specified disorders of eye and adnexa: Secondary | ICD-10-CM | POA: Diagnosis not present

## 2022-06-22 DIAGNOSIS — Z5321 Procedure and treatment not carried out due to patient leaving prior to being seen by health care provider: Secondary | ICD-10-CM | POA: Diagnosis not present

## 2022-06-22 DIAGNOSIS — H5711 Ocular pain, right eye: Secondary | ICD-10-CM | POA: Diagnosis present

## 2022-06-22 NOTE — ED Triage Notes (Signed)
Pt has right eye,lower eyelid swelling for about a month. No pain/fevers/n/v

## 2022-06-24 ENCOUNTER — Institutional Professional Consult (permissible substitution): Payer: Medicaid Other | Admitting: Licensed Clinical Social Worker

## 2022-07-02 NOTE — BH Specialist Note (Deleted)
Integrated Behavioral Health Initial In-Person Visit  MRN: [id number] Name: Tara Mckee  Number of Integrated Behavioral Health Clinician visits: No data recorded Session Start time: No data recorded   Session End time: No data recorded Total time in minutes: No data recorded  Types of Service: {CHL AMB TYPE OF SERVICE:[phone]}  Interpretor:{yes FV:[id number]} Interpretor Name and Language: ***   Warm Hand Off Completed.        Subjective: Tara Mckee is a 21 y.o. female accompanied by {CHL AMB ACCOMPANIED PR:[id number]} Patient was referred by *** for ***. Patient reports the following symptoms/concerns: *** Duration of problem: ***; Severity of problem: {Mild/Moderate/Severe:20260}  Objective: Mood: {BHH MOOD:22306} and Affect: {BHH AFFECT:22307} Risk of harm to self or others: {CHL AMB BH Suicide Current Mental Status:21022748}  Life Context: Family and Social: *** School/Work: *** Self-Care: *** Life Changes: ***  Patient and/or Family's Strengths/Protective Factors: {CHL AMB BH PROTECTIVE FACTORS:[phone]}  Goals Addressed: Patient will: Reduce symptoms of: {IBH Symptoms:21014056} Increase knowledge and/or ability of: {IBH Patient Tools:21014057}  Demonstrate ability to: {IBH Goals:21014053}  Progress towards Goals: {CHL AMB BH PROGRESS TOWARDS GOALS:[phone]}  Interventions: Interventions utilized: {IBH Interventions:21014054}  Standardized Assessments completed: {IBH Screening Tools:21014051}  Patient and/or Family Response: ***  Patient Centered Plan: Patient is on the following Treatment Plan(s):  ***  Assessment: Patient currently experiencing ***.   Patient may benefit from ***.  Plan: Follow up with behavioral health clinician on : *** Behavioral recommendations: *** Referral(s): {IBH Referrals:21014055} "From scale of 1-10, how likely are you to follow plan?": ***  Tara Mckee, [employer] [facility]

## 2022-07-03 ENCOUNTER — Telehealth: Payer: Self-pay | Admitting: Licensed Clinical Social Worker

## 2022-07-03 ENCOUNTER — Institutional Professional Consult (permissible substitution): Payer: Medicaid Other | Admitting: Licensed Clinical Social Worker

## 2022-07-03 ENCOUNTER — Ambulatory Visit: Payer: Medicaid Other

## 2022-07-03 NOTE — Telephone Encounter (Signed)
Called to discuss rescheduling missed BH appointment or resources for counseling nearer to patient's home or school. Left compliant voicemail requesting call back to [phone]
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
## 2022-07-06 ENCOUNTER — Ambulatory Visit: Payer: Medicaid Other | Attending: Pediatrics

## 2022-07-06 ENCOUNTER — Telehealth: Payer: Self-pay

## 2022-07-06 NOTE — Telephone Encounter (Signed)
LVM regarding today's missed appointment. Confirmed next appointment time. Reviewed attendance policy.  1st no-show  Tara Mckee, [place] 07/06/22 10:15 AM

## 2022-07-09 ENCOUNTER — Ambulatory Visit: Payer: Medicaid Other | Admitting: Physical Therapy

## 2022-07-09 ENCOUNTER — Telehealth: Payer: Self-pay | Admitting: Physical Therapy

## 2022-07-09 NOTE — Telephone Encounter (Signed)
LVM for pt about no show policy. Due to this being 3rd No show, told pt we would be discharging at this time. Pt informed she could get a new referral if she feels PT is still needed at this time.

## 2022-07-11 ENCOUNTER — Ambulatory Visit: Payer: Medicaid Other

## 2022-07-16 ENCOUNTER — Encounter: Payer: Medicaid Other | Admitting: Physical Therapy

## 2022-07-25 ENCOUNTER — Encounter (HOSPITAL_BASED_OUTPATIENT_CLINIC_OR_DEPARTMENT_OTHER): Payer: Self-pay

## 2022-07-25 ENCOUNTER — Emergency Department (HOSPITAL_BASED_OUTPATIENT_CLINIC_OR_DEPARTMENT_OTHER)
Admission: EM | Admit: 2022-07-25 | Discharge: 2022-07-25 | Disposition: A | Discharge status: Home / Self Care | Payer: Self-pay | Attending: Emergency Medicine | Admitting: Emergency Medicine

## 2022-07-25 ENCOUNTER — Other Ambulatory Visit: Payer: Self-pay

## 2022-07-25 ENCOUNTER — Emergency Department (HOSPITAL_BASED_OUTPATIENT_CLINIC_OR_DEPARTMENT_OTHER): Payer: Self-pay | Admitting: Radiology

## 2022-07-25 DIAGNOSIS — S6392XA Sprain of unspecified part of left wrist and hand, initial encounter: Secondary | ICD-10-CM | POA: Diagnosis not present

## 2022-07-25 DIAGNOSIS — Y990 Civilian activity done for income or pay: Secondary | ICD-10-CM | POA: Insufficient documentation

## 2022-07-25 DIAGNOSIS — X501XXA Overexertion from prolonged static or awkward postures, initial encounter: Secondary | ICD-10-CM | POA: Insufficient documentation

## 2022-07-25 MED ORDER — IBUPROFEN 200 MG PO TABS
600.0000 mg | ORAL_TABLET | Freq: Once | ORAL | Status: AC
  Administered 2022-07-25: 600 mg via ORAL
  Filled 2022-07-25: qty 1

## 2022-07-25 NOTE — ED Triage Notes (Signed)
Patient here POV from Home.  Endorses Twisting her Hand at Work at [employer] Today. Left Medial Hand Affected.   NAD Noted during Triage. A&Ox4. GCS 15. Ambulatory.

## 2022-07-25 NOTE — ED Provider Notes (Signed)
[facility] EMERGENCY DEPT Provider Note   CSN: [id number] Arrival date & time: 07/25/22  1924     History  Chief Complaint  Patient presents with   Hand Injury    Tara Mckee is a 21 y.o. female who presents to the emergency department with concerns for left hand injury onset prior to arrival.  Patient notes that she was attempting to lift a box while at [facility] when her hand twisted the wrong position.  No meds prior to arrival.  Has associated mild swelling to the area.  No color change or wound noted.  The history is provided by the patient. No language interpreter was used.       Home Medications Prior to Admission medications   Medication Sig Start Date End Date Taking? Authorizing Provider  triamcinolone cream (KENALOG) 0.1 % Apply to areas of eczema on body twice a day when needed; do not use on face Patient not taking: Reported on 06/05/2022 02/14/22   Maree Erie, MD      Allergies    Molds & smuts    Review of Systems   Review of Systems  Constitutional:  Negative for fever.  Musculoskeletal:  Positive for arthralgias and joint swelling.  Skin:  Negative for color change and wound.  All other systems reviewed and are negative.   Physical Exam Updated Vital Signs BP 124/83 (BP Location: Right Arm)   Pulse 87   Temp 98.1 F (36.7 C)   Resp 18   Ht 5' 5.5" (1.664 m)   Wt 54.4 kg   SpO2 100%   BMI 19.67 kg/m  Physical Exam Vitals and nursing note reviewed.  Constitutional:      General: She is not in acute distress.    Appearance: Normal appearance.  Eyes:     General: No scleral icterus.    Extraocular Movements: Extraocular movements intact.  Cardiovascular:     Rate and Rhythm: Normal rate.  Pulmonary:     Effort: Pulmonary effort is normal. No respiratory distress.  Musculoskeletal:     Cervical back: Neck supple.     Comments: finger to thumb opposition intact to left hand.  Grip strength 5/5 bilaterally.  Neurovascularly  intact.  Mild tenderness to palpation noted to left fourth and fifth metacarpal without overlying skin changes or obvious deformity.  Skin:    General: Skin is warm and dry.     Findings: No bruising, erythema or rash.  Neurological:     Mental Status: She is alert.  Psychiatric:        Behavior: Behavior normal.     ED Results / Procedures / Treatments   Labs (all labs ordered are listed, but only abnormal results are displayed) Labs Reviewed - No data to display  EKG None  Radiology DG Hand Complete Left  Result Date: 07/25/2022 CLINICAL DATA:  Left hand pain. EXAM: LEFT HAND - COMPLETE 3+ VIEW COMPARISON:  None Available. FINDINGS: There is no evidence of fracture or dislocation. There is no evidence of arthropathy or other focal bone abnormality. Soft tissues are unremarkable. IMPRESSION: Negative. Electronically Signed   By: Elgie Collard M.D.   On: 07/25/2022 20:03    Procedures Procedures    Medications Ordered in ED Medications  ibuprofen (ADVIL) tablet 600 mg (600 mg Oral Given 07/25/22 2039)    ED Course/ Medical Decision Making/ A&P  Medical Decision Making Amount and/or Complexity of Data Reviewed Radiology: ordered.   Patient with left hand pain onset prior to arrival while at work, patient notes she was attempting to lift a box when her hand twisted.  Patient afebrile.  On exam patient with finger to thumb opposition intact to left hand.  Grip strength 5/5 bilaterally.  Neurovascularly intact.  Mild tenderness to palpation noted to left fourth and fifth metacarpal without overlying skin changes or obvious deformity.  Differential diagnosis includes fracture, dislocation, sprain.  Imaging: I ordered imaging studies including left hand x-ray I independently visualized and interpreted imaging which showed: Negative for acute fracture or dislocation I agree with the radiologist interpretation  Medications:  I ordered medication  including ibuprofen for symptom management I have reviewed the patients home medicines and have made adjustments as needed    Disposition: Presentation suspicious for sprain of left hand.  Doubt fracture or dislocation at this time.  After consideration of the diagnostic results and the patients response to treatment, I feel that the patient would benefit from Discharge home.  Work note provided.  Strict return precautions provided to patient regarding fever, increasing/worsening knee swelling, color change, or gait issue. Supportive care measures and strict return precautions discussed with patient at bedside. Pt acknowledges and verbalizes understanding. Pt appears safe for discharge. Follow up as indicated in discharge paperwork.    This chart was dictated using voice recognition software, Dragon. Despite the best efforts of this provider to proofread and correct errors, errors may still occur which can change documentation meaning.   Final Clinical Impression(s) / ED Diagnoses Final diagnoses:  Hand sprain, left, initial encounter    Rx / DC Orders ED Discharge Orders     None         Arnett Duddy A, PA-C 07/26/22 1229    Vanetta Mulders, MD 07/26/22 [phone]

## 2022-07-25 NOTE — Discharge Instructions (Signed)
It was a pleasure taking care of you!   Your x-ray was negative for fracture or dislocation.  You may take over the counter 600 mg Ibuprofen every 6 hours or 500 mg Tylenol every 6 hours as needed for pain for no more than 7 days. You may apply ice to affected area for up to 15 minutes at a time. Ensure to place a barrier between your skin and the ice.  You may follow-up with your primary care provider as needed.  Return to the Emergency Department if you are experiencing increasing/worsening pain, swelling, color change, fever, or worsening symptoms.

## 2022-07-26 ENCOUNTER — Encounter: Payer: Self-pay | Admitting: *Deleted

## 2022-08-12 ENCOUNTER — Encounter: Payer: Self-pay | Admitting: Student in an Organized Health Care Education/Training Program

## 2022-08-12 ENCOUNTER — Ambulatory Visit (INDEPENDENT_AMBULATORY_CARE_PROVIDER_SITE_OTHER): Payer: Medicaid Other | Admitting: Student in an Organized Health Care Education/Training Program

## 2022-08-12 ENCOUNTER — Encounter: Payer: Self-pay | Admitting: Pediatrics

## 2022-08-12 VITALS — Wt 119.2 lb

## 2022-08-12 DIAGNOSIS — Z111 Encounter for screening for respiratory tuberculosis: Secondary | ICD-10-CM | POA: Diagnosis not present

## 2022-08-12 DIAGNOSIS — H0012 Chalazion right lower eyelid: Secondary | ICD-10-CM | POA: Diagnosis not present

## 2022-08-12 NOTE — Progress Notes (Signed)
History was provided by the patient.  Tara Mckee is a 21 y.o. female who is here for ppd.     HPI:  Requires ppd for work. No history of TB exposure. No SOB/dyspnea.   Also has bump under right lower eyelid. Not painful. No drainage. Has been there for 1 month. No eye redness. No fever.   The following portions of the patient's history were reviewed and updated as appropriate: allergies, current medications, past family history, past medical history, past social history, past surgical history, and problem list.  UTD imms  Physical Exam:  Wt 119 lb 3.2 oz (54.1 kg)   BMI 19.53 kg/m    General: Awake, alert, appropriately responsive in NAD HEENT: NCAT. EOMI, PERRL, clear sclera and conjunctiva. Non-tender <0.5 cm nodule located on right lower eyelid. Clear nares bilaterally. Oropharynx clear. MMM. CV: RRR, normal S1, S2. No murmur appreciated. 2+ distal pulses.  Pulmonary: CTAB, normal WOB. Good air movement bilaterally.  No focal W/R/R.  Extremities: Extremities WWP. Moves all extremities equally. Cap refill < 2 seconds.  Skin: No rashes or lesions appreciated.    Assessment/Plan:   1. PPD screening test Occupational screening. No risk factors. Advised to follow-up in 48-72 hours for reading.  - TB Skin Test  2. Chalazion of right lower eyelid Suspected chalazion based on history and exam. Recommended warm compresses. Counseled that may take months to resolve. Gave RTC precautions.    Chestine Spore, MD, MPH [facility] - Primary Care PGY-2  08/12/22

## 2022-08-12 NOTE — Patient Instructions (Signed)
Thanks for coming in today!  You are getting a ppd (or TB/Tuberculosis skin test). Please come back in 48-72 hours to have it read.  You also have a chalazion (or blocked oil duct), it can last for months, use a warm compress for 10-20 minutes daily.  Lastly, call [phone] for the Internal medicine clinic to ask about your appointment on 9/29.

## 2022-08-14 ENCOUNTER — Encounter: Payer: Self-pay | Admitting: Pediatrics

## 2022-08-14 ENCOUNTER — Ambulatory Visit (INDEPENDENT_AMBULATORY_CARE_PROVIDER_SITE_OTHER): Payer: Medicaid Other | Admitting: Pediatrics

## 2022-08-14 ENCOUNTER — Other Ambulatory Visit (HOSPITAL_COMMUNITY)
Admission: RE | Admit: 2022-08-14 | Discharge: 2022-08-14 | Disposition: A | Discharge status: Home / Self Care | Payer: Medicaid Other | Source: Ambulatory Visit | Attending: Pediatrics | Admitting: Pediatrics

## 2022-08-14 VITALS — BP 118/78 | Wt 118.9 lb

## 2022-08-14 DIAGNOSIS — N939 Abnormal uterine and vaginal bleeding, unspecified: Secondary | ICD-10-CM | POA: Diagnosis not present

## 2022-08-14 DIAGNOSIS — N3000 Acute cystitis without hematuria: Secondary | ICD-10-CM | POA: Diagnosis not present

## 2022-08-14 DIAGNOSIS — R300 Dysuria: Secondary | ICD-10-CM

## 2022-08-14 DIAGNOSIS — Z111 Encounter for screening for respiratory tuberculosis: Secondary | ICD-10-CM | POA: Diagnosis not present

## 2022-08-14 DIAGNOSIS — N760 Acute vaginitis: Secondary | ICD-10-CM | POA: Diagnosis not present

## 2022-08-14 DIAGNOSIS — Z3202 Encounter for pregnancy test, result negative: Secondary | ICD-10-CM

## 2022-08-14 LAB — POCT URINALYSIS DIPSTICK
Bilirubin, UA: NEGATIVE
Blood, UA: NEGATIVE
Glucose, UA: NEGATIVE
Ketones, UA: NEGATIVE
Nitrite, UA: POSITIVE
Protein, UA: POSITIVE — AB
Spec Grav, UA: 1.025 (ref 1.010–1.025)
Urobilinogen, UA: 0.2 E.U./dL
pH, UA: 5 (ref 5.0–8.0)

## 2022-08-14 LAB — TB SKIN TEST
Induration: 0 mm
TB Skin Test: NEGATIVE

## 2022-08-14 LAB — POCT URINE PREGNANCY: Preg Test, Ur: NEGATIVE

## 2022-08-14 LAB — POCT HEMOGLOBIN: Hemoglobin: 13.7 g/dL (ref 11–14.6)

## 2022-08-14 MED ORDER — CEPHALEXIN 500 MG PO CAPS: 500.0000 mg | ORAL_CAPSULE | Freq: Two times a day (BID) | ORAL | 0 refills | Status: AC

## 2022-08-14 MED ORDER — NORELGESTROMIN-ETH ESTRADIOL 150-35 MCG/24HR TD PTWK: MEDICATED_PATCH | TRANSDERMAL | 12 refills | Status: DC
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
## 2022-08-14 NOTE — Patient Instructions (Addendum)
Tara Mckee it was a pleasure seeing you in clinic today! Here is a summary of what I would like for you to remember from your visit today:  - Please pick up your patch from the pharmacy and use it as prescribed - If you do not get a phone call from the gynecology office in 1 week, please let our office know - [email or web address] is a great website for birth control options, descriptions, and other reproductive health information - You can call our clinic with any questions, concerns, or to schedule an appointment at [phone]  Sincerely,  Dr. Leeann Must and [facility] [address] [place], [place] [address] [phone]   Upcoming appointments: Keep your appointment with Nutrition Department. Your appointment at [facility] for Oct 2nd has been cancelled. Keep your scheduled appointment with internal medicine.

## 2022-08-16 LAB — URINE CYTOLOGY ANCILLARY ONLY
Chlamydia: NEGATIVE
Comment: NEGATIVE
Comment: NORMAL
Neisseria Gonorrhea: NEGATIVE

## 2022-08-17 LAB — URINE CULTURE
MICRO NUMBER:: 13912859
SPECIMEN QUALITY:: ADEQUATE

## 2022-08-20 ENCOUNTER — Ambulatory Visit: Payer: Medicaid Other | Admitting: Registered"

## 2022-08-30 NOTE — Progress Notes (Deleted)
   CC: new patient appointment  HPI:  [name] is a 21 y.o. female with past medical history of migraines, atopic dermatitis, and asthma that presents for a new patient appointment.    PMH:  SH:  Surgical:  Sexually active, # of partners, sex of partners, using protection:     Past Medical History:  Diagnosis Date   Asthma    Eczema    Review of Systems:  per HPI.   Physical Exam: *** There were no vitals filed for this visit.  *** Constitutional: Well-developed, well-nourished, appears comfortable  HENT: Normocephalic and atraumatic.  Eyes: EOM are normal. PERRL.  Neck: Normal range of motion.  Cardiovascular: Regular rate, regular rhythm. No murmurs, rubs, or gallops. Normal radial and PT pulses bilaterally. No LE edema.  Pulmonary: Normal respiratory effort. No wheezes, rales, or rhonchi.   Abdominal: Soft. Non-distended. No tenderness. Normal bowel sounds.  Musculoskeletal: Normal range of motion.     Neurological: Alert and oriented to person, place, and time. Non-focal. Skin: warm and dry.    Assessment & Plan:   *** Current medications include Norelgestromin-ethinyl estradiol [name] 150-35 MCG/24HR transdermal patch, triamcinolone cream 0.1%  ?: ***fever, chills, dysuria, vaginal bleeding, pelvic pain  Abnormal Uterine Bleeding - Noted at pediatrics visit in 08/2022 - Abnormal uterine bleeding since starting Depo-provera with 5 weeks of consistent bleeding - Norelgestromin-ethinyl estradiol [name] 150-35 MCG/24HR transdermal patch - Referred to gynecology  - Current medications include  Plan: -   2. UTI - Presented w/ dysuria at pediatrics visit on 08/14/2022 - Leukocytes and nitrites positive on urine dipstick. Urine culture positive for klebsiella pneumoniae. Urine pregnancy was negative. GC/chlamydia negative.  - Culture sensitive to amoxacillin/clavulanate (augmentin) but not reportable for cefazolin - Cephalexin 500 mg BID, 5 days  -  Current medications include  Plan: -  3. - Current medications include  Plan: -  4. Health Screening: - Influenza vaccine () - Medication refill?  Plan: -   See Encounters Tab for problem based charting.  Patient seen with Dr. Barbaraann Boys

## 2022-09-02 ENCOUNTER — Ambulatory Visit: Payer: Medicaid Other | Admitting: Pediatrics

## 2022-09-10 ENCOUNTER — Emergency Department (HOSPITAL_BASED_OUTPATIENT_CLINIC_OR_DEPARTMENT_OTHER)
Admission: EM | Admit: 2022-09-10 | Discharge: 2022-09-11 | Disposition: A | Discharge status: Home / Self Care | Payer: Medicaid Other | Attending: Emergency Medicine | Admitting: Emergency Medicine

## 2022-09-10 ENCOUNTER — Emergency Department (HOSPITAL_BASED_OUTPATIENT_CLINIC_OR_DEPARTMENT_OTHER): Payer: Medicaid Other

## 2022-09-10 ENCOUNTER — Encounter (HOSPITAL_BASED_OUTPATIENT_CLINIC_OR_DEPARTMENT_OTHER): Payer: Self-pay | Admitting: Emergency Medicine

## 2022-09-10 ENCOUNTER — Other Ambulatory Visit: Payer: Self-pay

## 2022-09-10 DIAGNOSIS — R42 Dizziness and giddiness: Secondary | ICD-10-CM | POA: Insufficient documentation

## 2022-09-10 DIAGNOSIS — Z20822 Contact with and (suspected) exposure to covid-19: Secondary | ICD-10-CM | POA: Diagnosis not present

## 2022-09-10 DIAGNOSIS — N3000 Acute cystitis without hematuria: Secondary | ICD-10-CM | POA: Diagnosis not present

## 2022-09-10 LAB — CBC WITH DIFFERENTIAL/PLATELET
Abs Immature Granulocytes: 0.01 10*3/uL (ref 0.00–0.07)
Basophils Absolute: 0 10*3/uL (ref 0.0–0.1)
Basophils Relative: 1 %
Eosinophils Absolute: 0.1 10*3/uL (ref 0.0–0.5)
Eosinophils Relative: 2 %
HCT: 38.6 % (ref 36.0–46.0)
Hemoglobin: 12.5 g/dL (ref 12.0–15.0)
Immature Granulocytes: 0 %
Lymphocytes Relative: 44 %
Lymphs Abs: 2.5 10*3/uL (ref 0.7–4.0)
MCH: 27.8 pg (ref 26.0–34.0)
MCHC: 32.4 g/dL (ref 30.0–36.0)
MCV: 86 fL (ref 80.0–100.0)
Monocytes Absolute: 0.3 10*3/uL (ref 0.1–1.0)
Monocytes Relative: 6 %
Neutro Abs: 2.7 10*3/uL (ref 1.7–7.7)
Neutrophils Relative %: 47 %
Platelets: 248 10*3/uL (ref 150–400)
RBC: 4.49 MIL/uL (ref 3.87–5.11)
RDW: 12.8 % (ref 11.5–15.5)
WBC: 5.7 10*3/uL (ref 4.0–10.5)
nRBC: 0 % (ref 0.0–0.2)

## 2022-09-10 LAB — BASIC METABOLIC PANEL
Anion gap: 3 — ABNORMAL LOW (ref 5–15)
BUN: 13 mg/dL (ref 6–20)
CO2: 26 mmol/L (ref 22–32)
Calcium: 8.4 mg/dL — ABNORMAL LOW (ref 8.9–10.3)
Chloride: 108 mmol/L (ref 98–111)
Creatinine, Ser: 0.92 mg/dL (ref 0.44–1.00)
GFR, Estimated: >60 mL/min (ref >60)
Glucose, Bld: 90 mg/dL (ref 70–99)
Potassium: 3.8 mmol/L (ref 3.5–5.1)
Sodium: 137 mmol/L (ref 135–145)

## 2022-09-10 LAB — RESP PANEL BY RT-PCR (FLU A&B, COVID) ARPGX2
Influenza A by PCR: NEGATIVE
Influenza B by PCR: NEGATIVE
SARS Coronavirus 2 by RT PCR: NEGATIVE

## 2022-09-10 LAB — URINALYSIS, ROUTINE W REFLEX MICROSCOPIC
Bilirubin Urine: NEGATIVE
Glucose, UA: NEGATIVE mg/dL
Ketones, ur: NEGATIVE mg/dL
Nitrite: POSITIVE — AB
Protein, ur: NEGATIVE mg/dL
Specific Gravity, Urine: 1.02 (ref 1.005–1.030)
pH: 7.5 (ref 5.0–8.0)

## 2022-09-10 LAB — URINALYSIS, MICROSCOPIC (REFLEX)

## 2022-09-10 LAB — PREGNANCY, URINE: Preg Test, Ur: NEGATIVE

## 2022-09-10 LAB — MAGNESIUM: Magnesium: 2 mg/dL (ref 1.7–2.4)

## 2022-09-10 MED ORDER — LACTATED RINGERS IV BOLUS
1000.0000 mL | Freq: Once | INTRAVENOUS | Status: AC
  Administered 2022-09-11: 1000 mL via INTRAVENOUS

## 2022-09-10 NOTE — ED Notes (Signed)
At 3 minute mark of standing Orthostatic VS patient states she began to feel a little dizzy and needed to sit down.

## 2022-09-10 NOTE — ED Triage Notes (Signed)
Patient reports dizziness for "a long time now" and began having headaches and body aches yesterday. States she has seen her PCP for the same, but she does not know what causes these symptoms.

## 2022-09-11 MED ORDER — CIPROFLOXACIN HCL 500 MG PO TABS: 500.0000 mg | ORAL_TABLET | Freq: Two times a day (BID) | ORAL | 0 refills | Status: DC

## 2022-09-11 MED ORDER — CIPROFLOXACIN IN D5W 400 MG/200ML IV SOLN
400.0000 mg | Freq: Once | INTRAVENOUS | Status: AC
  Administered 2022-09-11: 400 mg via INTRAVENOUS
  Filled 2022-09-11: qty 200

## 2022-09-11 MED ORDER — ONDANSETRON 4 MG PO TBDP: ORAL_TABLET | ORAL | 0 refills | Status: DC

## 2022-09-11 MED ORDER — ONDANSETRON HCL 4 MG/2ML IJ SOLN
4.0000 mg | Freq: Once | INTRAMUSCULAR | Status: AC
  Administered 2022-09-11: 4 mg via INTRAVENOUS
  Filled 2022-09-11: qty 2

## 2022-09-11 NOTE — ED Provider Notes (Signed)
[place] EMERGENCY DEPARTMENT Provider Note   CSN: [id number] Arrival date & time: 09/10/22  2215     History  Chief Complaint  Patient presents with   Dizziness    Tara Mckee is a 21 y.o. female.  21 year old female who presents the ER today with dizziness.  Patient is with her mother who helps supply the history.  Sounds the patient's had orthostatic dizziness on and off for the last couple years but has been, more frequently over the last month.  She states she got a Depo shot last month and had a month worth of bleeding after that.  States that she oftentimes gets lightheaded and a little bit vertiginous when she stands up and then also when she moves her head a certain way too fast.  She is been urinating more than normal recently which is caused her to drink more than normal.  She does not have a dry mouth or feel dehydrated otherwise.  She states that she was treated for urinary tract infection last month.  I reviewed the records she had a urinary culture that showed Klebsiella that was sensitive to multiple cephalosporins and I believe she was on Keflex.  I do not know how long she was on it though.   Dizziness      Home Medications Prior to Admission medications   Medication Sig Start Date End Date Taking? Authorizing Provider  ciprofloxacin (CIPRO) 500 MG tablet Take 1 tablet (500 mg total) by mouth every 12 (twelve) hours. 09/11/22  Yes Tara Mckee, Tara Cornea, MD  ondansetron (ZOFRAN-ODT) 4 MG disintegrating tablet 4mg  ODT q4 hours prn nausea/vomit 09/11/22  Yes Tara Mckee, Tara Cornea, MD  norelgestromin-ethinyl estradiol Tara Mckee) 150-35 MCG/24HR transdermal patch Place 1 patch onto the skin - lower back, thigh, or bicep - for 7 days. Change this patch once every seven days for 3 weeks. Do not place a patch for the fourth week. Repeat this 4 week cycle. 08/14/22   Tara Love, MD  triamcinolone cream (KENALOG) 0.1 % Apply to areas of eczema on body twice a day when needed; do  not use on face Patient not taking: Reported on 06/05/2022 02/14/22   Tara Leyden, MD      Allergies    Molds & smuts    Review of Systems   Review of Systems  Neurological:  Positive for dizziness.    Physical Exam Updated Vital Signs BP (!) 96/55   Pulse 75   Temp 98.6 F (37 C) (Oral)   Resp 19   Ht 5\' 6"  (1.676 m)   Wt 54.2 kg   SpO2 99%   BMI 19.29 kg/m  Physical Exam Vitals and nursing note reviewed.  Constitutional:      Appearance: She is well-developed.  HENT:     Head: Normocephalic and atraumatic.     Mouth/Throat:     Mouth: Mucous membranes are moist.     Pharynx: Oropharynx is clear.  Eyes:     Pupils: Pupils are equal, round, and reactive to light.  Cardiovascular:     Rate and Rhythm: Normal rate and regular rhythm.     Comments: Reportedly orthostatic symptomatically on standing up.  Also heart rate went in the 90s. Pulmonary:     Effort: No respiratory distress.     Breath sounds: No stridor.  Abdominal:     General: Abdomen is flat. There is no distension.  Musculoskeletal:        General: No tenderness. Normal range of motion.  Cervical back: Normal range of motion.  Skin:    General: Skin is warm and dry.  Neurological:     General: No focal deficit present.     Mental Status: She is alert.     ED Results / Procedures / Treatments   Labs (all labs ordered are listed, but only abnormal results are displayed) Labs Reviewed  BASIC METABOLIC PANEL - Abnormal; Notable for the following components:      Result Value   Calcium 8.4 (*)    Anion gap 3 (*)    All other components within normal limits  URINALYSIS, ROUTINE W REFLEX MICROSCOPIC - Abnormal; Notable for the following components:   APPearance HAZY (*)    Hgb urine dipstick MODERATE (*)    Nitrite POSITIVE (*)    Leukocytes,Ua TRACE (*)    All other components within normal limits  URINALYSIS, MICROSCOPIC (REFLEX) - Abnormal; Notable for the following components:    Bacteria, UA MANY (*)    All other components within normal limits  RESP PANEL BY RT-PCR (FLU A&B, COVID) [id number]  URINE CULTURE  CBC WITH DIFFERENTIAL/PLATELET  PREGNANCY, URINE  MAGNESIUM    EKG None  Radiology DG Chest 2 View  Result Date: 09/10/2022 CLINICAL DATA:  dizziness EXAM: CHEST - 2 VIEW COMPARISON:  None Available. FINDINGS: The heart and mediastinal contours are within normal limits. No focal consolidation. No pulmonary edema. No pleural effusion. No pneumothorax. No acute osseous abnormality. IMPRESSION: No active cardiopulmonary disease. Electronically Signed   By: Tara Mckee M.D.   On: 09/10/2022 23:00    Procedures Procedures    Medications Ordered in ED Medications  lactated ringers bolus 1,000 mL (0 mLs Intravenous Stopped 09/11/22 0137)  ciprofloxacin (CIPRO) IVPB 400 mg (0 mg Intravenous Stopped 09/11/22 0219)  ondansetron (ZOFRAN) injection 4 mg (4 mg Intravenous Given 09/11/22 0103)    ED Course/ Medical Decision Making/ A&P                           Medical Decision Making Amount and/or Complexity of Data Reviewed Labs: ordered.  Risk Prescription drug management.   I reviewed the labs she is mildly hypocalcemic but I would not expect this to be symptomatic.  She has no evidence of diabetes.  It does appear she has a urinary tract infection but not septic or any acute kidney injury to go along with that.  She did have positive orthostatics as likely the source of her lightheadedness when standing and possibly related to urinary tract infection and increased urinary frequency.  We will give a liter of fluids and start her on Cipro likely discharge on same.  I also discussed with her and her mother that she needs her urinalysis rechecked a couple days after finishing antibiotics to make sure the infection has gone fully away.  Final Clinical Impression(s) / ED Diagnoses Final diagnoses:  Acute cystitis without hematuria  Dizziness    Rx / DC  Orders ED Discharge Orders          Ordered    ciprofloxacin (CIPRO) 500 MG tablet  Every 12 hours        09/11/22 0217    ondansetron (ZOFRAN-ODT) 4 MG disintegrating tablet        09/11/22 0217              Dent Plantz, Tara Cower, MD 09/11/22 [phone]

## 2022-09-13 LAB — URINE CULTURE: Culture: >=100000 — AB

## 2022-09-14 ENCOUNTER — Emergency Department (HOSPITAL_BASED_OUTPATIENT_CLINIC_OR_DEPARTMENT_OTHER)
Admission: EM | Admit: 2022-09-14 | Discharge: 2022-09-15 | Disposition: A | Discharge status: Home / Self Care | Payer: Medicaid Other | Attending: Emergency Medicine | Admitting: Emergency Medicine

## 2022-09-14 ENCOUNTER — Other Ambulatory Visit: Payer: Self-pay

## 2022-09-14 ENCOUNTER — Telehealth (HOSPITAL_BASED_OUTPATIENT_CLINIC_OR_DEPARTMENT_OTHER): Payer: Self-pay | Admitting: *Deleted

## 2022-09-14 ENCOUNTER — Encounter (HOSPITAL_BASED_OUTPATIENT_CLINIC_OR_DEPARTMENT_OTHER): Payer: Self-pay | Admitting: Emergency Medicine

## 2022-09-14 DIAGNOSIS — R519 Headache, unspecified: Secondary | ICD-10-CM | POA: Insufficient documentation

## 2022-09-14 DIAGNOSIS — R110 Nausea: Secondary | ICD-10-CM | POA: Insufficient documentation

## 2022-09-14 DIAGNOSIS — R4182 Altered mental status, unspecified: Secondary | ICD-10-CM | POA: Diagnosis not present

## 2022-09-14 DIAGNOSIS — E860 Dehydration: Secondary | ICD-10-CM | POA: Insufficient documentation

## 2022-09-14 DIAGNOSIS — R55 Syncope and collapse: Secondary | ICD-10-CM | POA: Diagnosis not present

## 2022-09-14 DIAGNOSIS — J45909 Unspecified asthma, uncomplicated: Secondary | ICD-10-CM | POA: Diagnosis not present

## 2022-09-14 LAB — CBG MONITORING, ED: Glucose-Capillary: 81 mg/dL (ref 70–99)

## 2022-09-14 MED ORDER — SODIUM CHLORIDE 0.9 % IV BOLUS
1000.0000 mL | Freq: Once | INTRAVENOUS | Status: AC
  Administered 2022-09-15: 1000 mL via INTRAVENOUS

## 2022-09-14 NOTE — ED Triage Notes (Signed)
Pt was seen in this ED 10/10 and tx for UTI. Pt states she continues to be dizzy intermittently (more often than previous visit), nausea (continued, not worsened). Dysuria is "somewhat better". Pt also reports continued HA. Mother mentions on arrival to ED pt "went out" on further explanation pt was alert, but seemed confused and asked her mother "what happened'. Mother requesting CT head. Encouraged to discuss her concerns with EDP.

## 2022-09-14 NOTE — Telephone Encounter (Signed)
Post ED Visit - Positive Culture Follow-up  Culture report reviewed by antimicrobial stewardship pharmacist: [facility] Team []  Elenor Quinones, Pharm.D. []  Heide Guile, Pharm.D., BCPS AQ-ID []  Parks Neptune, Pharm.D., BCPS []  Alycia Rossetti, Pharm.D., BCPS []  [place], Pharm.D., BCPS, AAHIVP []  Legrand Como, Pharm.D., BCPS, AAHIVP []  Salome Arnt, PharmD, BCPS []  Johnnette Gourd, PharmD, BCPS []  Hughes Better, PharmD, BCPS []  Leeroy Cha, PharmD []  Laqueta Linden, PharmD, BCPS [x]  Roderic Ovens, PharmD  [facility] Team []  Leodis Sias, PharmD []  Lindell Spar, PharmD []  Royetta Asal, PharmD []  Graylin Shiver, Rph []  Rema Fendt) Glennon Mac, PharmD []  Arlyn Dunning, PharmD []  Netta Cedars, PharmD []  Dia Sitter, PharmD []  Leone Haven, PharmD []  Gretta Arab, PharmD []  Theodis Shove, PharmD []  Peggyann Juba, PharmD []  Reuel Boom, PharmD   Positive urine culture Treated with Ciprofloxacin, organism sensitive to the same and no further patient follow-up is required at this time.  Rosie Fate 09/14/2022, 12:08 PM

## 2022-09-15 ENCOUNTER — Emergency Department (HOSPITAL_BASED_OUTPATIENT_CLINIC_OR_DEPARTMENT_OTHER): Payer: Medicaid Other

## 2022-09-15 DIAGNOSIS — R4182 Altered mental status, unspecified: Secondary | ICD-10-CM | POA: Diagnosis not present

## 2022-09-15 LAB — COMPREHENSIVE METABOLIC PANEL
ALT: 16 U/L (ref 0–44)
AST: 18 U/L (ref 15–41)
Albumin: 3.9 g/dL (ref 3.5–5.0)
Alkaline Phosphatase: 71 U/L (ref 38–126)
Anion gap: 5 (ref 5–15)
BUN: 20 mg/dL (ref 6–20)
CO2: 24 mmol/L (ref 22–32)
Calcium: 8.7 mg/dL — ABNORMAL LOW (ref 8.9–10.3)
Chloride: 108 mmol/L (ref 98–111)
Creatinine, Ser: 1.16 mg/dL — ABNORMAL HIGH (ref 0.44–1.00)
GFR, Estimated: >60 mL/min (ref >60)
Glucose, Bld: 90 mg/dL (ref 70–99)
Potassium: 3.9 mmol/L (ref 3.5–5.1)
Sodium: 137 mmol/L (ref 135–145)
Total Bilirubin: 0.4 mg/dL (ref 0.3–1.2)
Total Protein: 7.3 g/dL (ref 6.5–8.1)

## 2022-09-15 LAB — CBC WITH DIFFERENTIAL/PLATELET
Abs Immature Granulocytes: 0.01 10*3/uL (ref 0.00–0.07)
Basophils Absolute: 0 10*3/uL (ref 0.0–0.1)
Basophils Relative: 1 %
Eosinophils Absolute: 0.1 10*3/uL (ref 0.0–0.5)
Eosinophils Relative: 2 %
HCT: 40.6 % (ref 36.0–46.0)
Hemoglobin: 13.2 g/dL (ref 12.0–15.0)
Immature Granulocytes: 0 %
Lymphocytes Relative: 49 %
Lymphs Abs: 2.2 10*3/uL (ref 0.7–4.0)
MCH: 27.7 pg (ref 26.0–34.0)
MCHC: 32.5 g/dL (ref 30.0–36.0)
MCV: 85.3 fL (ref 80.0–100.0)
Monocytes Absolute: 0.3 10*3/uL (ref 0.1–1.0)
Monocytes Relative: 6 %
Neutro Abs: 1.9 10*3/uL (ref 1.7–7.7)
Neutrophils Relative %: 42 %
Platelets: 265 10*3/uL (ref 150–400)
RBC: 4.76 MIL/uL (ref 3.87–5.11)
RDW: 12.7 % (ref 11.5–15.5)
WBC: 4.4 10*3/uL (ref 4.0–10.5)
nRBC: 0 % (ref 0.0–0.2)

## 2022-09-15 LAB — URINALYSIS, ROUTINE W REFLEX MICROSCOPIC
Bilirubin Urine: NEGATIVE
Glucose, UA: NEGATIVE mg/dL
Ketones, ur: NEGATIVE mg/dL
Leukocytes,Ua: NEGATIVE
Nitrite: NEGATIVE
Protein, ur: NEGATIVE mg/dL
Specific Gravity, Urine: >=1.03 (ref 1.005–1.030)
pH: 6.5 (ref 5.0–8.0)

## 2022-09-15 LAB — URINALYSIS, MICROSCOPIC (REFLEX)

## 2022-09-15 LAB — PREGNANCY, URINE: Preg Test, Ur: NEGATIVE

## 2022-09-15 LAB — LIPASE, BLOOD: Lipase: 33 U/L (ref 11–51)

## 2022-09-15 MED ORDER — PROMETHAZINE HCL 25 MG PO TABS: 25.0000 mg | ORAL_TABLET | Freq: Four times a day (QID) | ORAL | 0 refills | Status: DC | PRN

## 2022-09-15 NOTE — ED Provider Notes (Signed)
Emergency Department Provider Note   I have reviewed the triage vital signs and the nursing notes.   HISTORY  Chief Complaint Follow-up   HPI Tara Mckee is a 21 y.o. female with PMH of asthma presents to the emergency department for evaluation of of intermittent lightheadedness/near syncope, poor appetite, headache.  Patient presents here with mom.  She tells me that since she was 17 she has had intermittent episodes similar to this with no clear diagnosis made.  Over the past 2 weeks her symptoms of been more severe.  This prompted her to seek ED evaluation on 10/11 where she was diagnosed with a urinary tract infection.  She is been compliant with antibiotics and states that her dysuria has improved significantly.  She continues to have nausea, not responding to Zofran, and poor appetite.  She is able to drink fluids.  Today she felt lightheaded to the point of almost passing out and mom noted a brief episode of confusion without any clear seizure activity.  No fevers or chills.  No back pain.  No abdominal discomfort.  She has felt some continued nausea but denies diarrhea. She has had HA, which is also typical for her. No sudden onset, maximal intensity HA symptoms. No unilateral weakness/numbness.    Past Medical History:  Diagnosis Date   Asthma    Eczema     Review of Systems  Constitutional: No fever/chills. Positive fatigue.  Eyes: No visual changes. ENT: No sore throat. Cardiovascular: Denies chest pain. Positive near syncope.  Respiratory: Denies shortness of breath. Gastrointestinal: No abdominal pain. Positive nausea, no vomiting.  No diarrhea.  No constipation. Genitourinary: Improving dysuria. Musculoskeletal: Negative for back pain. Skin: Negative for rash. Neurological: Negative for focal weakness or numbness. Positive HA.    ____________________________________________   PHYSICAL EXAM:  VITAL SIGNS: ED Triage Vitals  Enc Vitals Group     BP 09/14/22  2339 126/82     Pulse Rate 09/14/22 2339 82     Resp 09/14/22 2339 20     Temp 09/14/22 2339 98.7 F (37.1 C)     Temp Source 09/14/22 2339 Oral     SpO2 09/14/22 2339 100 %     Weight 09/14/22 2341 123 lb 0.3 oz (55.8 kg)   Constitutional: Alert and oriented. Well appearing and in no acute distress. Eyes: Conjunctivae are normal. PERRL. Head: Atraumatic. Nose: No congestion/rhinnorhea. Mouth/Throat: Mucous membranes are slightly dry.  Neck: No stridor.   Cardiovascular: Normal rate, regular rhythm. Good peripheral circulation. Grossly normal heart sounds.   Respiratory: Normal respiratory effort.  No retractions. Lungs CTAB. Gastrointestinal: Soft and nontender. No distention.  Musculoskeletal: No lower extremity tenderness nor edema. No gross deformities of extremities. Neurologic:  Normal speech and language. No gross focal neurologic deficits are appreciated. 5/5 strength in the bilateral upper/lower extremities. No numbness.  Skin:  Skin is warm, dry and intact. No rash noted.  ____________________________________________   LABS (all labs ordered are listed, but only abnormal results are displayed)  Labs Reviewed  COMPREHENSIVE METABOLIC PANEL - Abnormal; Notable for the following components:      Result Value   Creatinine, Ser 1.16 (*)    Calcium 8.7 (*)    All other components within normal limits  URINALYSIS, ROUTINE W REFLEX MICROSCOPIC - Abnormal; Notable for the following components:   Hgb urine dipstick TRACE (*)    All other components within normal limits  URINALYSIS, MICROSCOPIC (REFLEX) - Abnormal; Notable for the following components:   Bacteria,  UA FEW (*)    All other components within normal limits  URINE CULTURE  LIPASE, BLOOD  CBC WITH DIFFERENTIAL/PLATELET  PREGNANCY, URINE  CBG MONITORING, ED   ____________________________________________  EKG   EKG Interpretation  Date/Time:  Sunday September 15 2022 00:05:39 EDT Ventricular Rate:  79 PR  Interval:  139 QRS Duration: 84 QT Interval:  373 QTC Calculation: 428 R Axis:   85 Text Interpretation: Sinus rhythm Confirmed by Nanda Quinton [phone]) on 09/15/2022 1:19:56 AM        ____________________________________________  RADIOLOGY  CT Head Wo Contrast  Result Date: 09/15/2022 CLINICAL DATA:  Mental status change, unknown cause EXAM: CT HEAD WITHOUT CONTRAST TECHNIQUE: Contiguous axial images were obtained from the base of the skull through the vertex without intravenous contrast. RADIATION DOSE REDUCTION: This exam was performed according to the departmental dose-optimization program which includes automated exposure control, adjustment of the mA and/or kV according to patient size and/or use of iterative reconstruction technique. COMPARISON:  None Available. FINDINGS: Brain: No acute intracranial abnormality. Specifically, no hemorrhage, hydrocephalus, mass lesion, acute infarction, or significant intracranial injury. Vascular: No hyperdense vessel or unexpected calcification. Skull: No acute calvarial abnormality. Sinuses/Orbits: No acute findings Other: None IMPRESSION: Normal study. Electronically Signed   By: Rolm Baptise M.D.   On: 09/15/2022 00:20    ____________________________________________   PROCEDURES  Procedure(s) performed:   Procedures  None  ____________________________________________   INITIAL IMPRESSION / ASSESSMENT AND PLAN / ED COURSE  Pertinent labs & imaging results that were available during my care of the patient were reviewed by me and considered in my medical decision making (see chart for details).   This patient is Presenting for Evaluation of HA, which does require a range of treatment options, and is a complaint that involves a high risk of morbidity and mortality.  The Differential Diagnoses includes but is not exclusive to subarachnoid hemorrhage, meningitis, encephalitis, previous head trauma, cavernous venous thrombosis, muscle  tension headache, glaucoma, temporal arteritis, migraine or migraine equivalent, etc.   Critical Interventions-    Medications  sodium chloride 0.9 % bolus 1,000 mL (0 mLs Intravenous Stopped 09/15/22 0126)    Reassessment after intervention: Symptoms improved. Negative orthostatic vitals.    I did obtain Additional Historical Information from Mom at bedside.  I decided to review pertinent External Data, and in summary patient evaluated on 10/11 with similar symptoms.   Clinical Laboratory Tests Ordered, included no evidence of urinary tract infection.  Pregnancy negative.  No acute kidney injury.  Very mild hypocalcemia at 8.7.  Normal electrolytes otherwise.  No leukocytosis or anemia.  Lipase normal.  Radiologic Tests Ordered, included CT head. I independently interpreted the images and agree with radiology interpretation.   Cardiac Monitor Tracing which shows NSR without ectopy.    Social Determinants of Health Risk patient denies any drugs or EtOH.   Medical Decision Making: Summary:  Patient presents to the emergency department with lightheadedness/near syncope along with headache.  No sudden onset/maximal intensity headache symptoms.  She is neurologically intact now with intermittent headaches and question of some altered mental status earlier today CT imaging obtained of the head which was normal.  She is feeling much better after IV fluids.  Clinically suspect dehydration.  UA appears to be improving with outpatient antibiotics and advised that she continue this.  Reevaluation with update and discussion with patient after IV fluids.  She is feeling much better and reports feeling well enough for discharge.  Plan to change her  prescription from Zofran to Phenergan but discussed the drowsy side effects of this medication.  Considered admission but patient feeling improved after treatment here with reassuring lab work.  Plan for close outpatient follow-up rather than admission.    Disposition: discharge  ____________________________________________  FINAL CLINICAL IMPRESSION(S) / ED DIAGNOSES  Final diagnoses:  Dehydration  Near syncope  Nausea  Acute nonintractable headache, unspecified headache type     NEW OUTPATIENT MEDICATIONS STARTED DURING THIS VISIT:  Discharge Medication List as of 09/15/2022  1:18 AM     START taking these medications   Details  promethazine (PHENERGAN) 25 MG tablet Take 1 tablet (25 mg total) by mouth every 6 (six) hours as needed for nausea or vomiting., Starting Sun 09/15/2022, Normal        Note:  This document was prepared using Dragon voice recognition software and may include unintentional dictation errors.  Alona Bene, MD, [facility] Emergency Medicine    Tremaine Fuhriman, Arlyss Repress, MD 09/15/22 [phone]

## 2022-09-15 NOTE — ED Notes (Signed)
Pt states that she feels better.  Pt was reporting symptoms which had been intermittent for 4 years.

## 2022-09-15 NOTE — Discharge Instructions (Signed)
You are seen emergency room today with lightheadedness, nausea, headache.  Your lab work and CT scans are looking normal.  I have called in a different nausea medicine to your pharmacy and would like for you to call your primary care doctor first thing on Monday morning schedule close follow-up appointment.  Please drink plenty of fluids.  Return to the emergency department any new or suddenly worsening symptoms.

## 2022-09-15 NOTE — ED Notes (Signed)
Patient transported to CT 

## 2022-09-16 ENCOUNTER — Telehealth: Payer: Self-pay

## 2022-09-16 LAB — URINE CULTURE: Culture: NO GROWTH

## 2022-09-16 NOTE — Patient Outreach (Signed)
Care Coordination  09/16/2022  Cailah Reach 11-07-2001 [id number]  Transition Care Management Unsuccessful Follow-up Telephone Call  Date of discharge and from where:  09/15/22 [facility]   Attempts:  1st Attempt  Reason for unsuccessful TCM follow-up call:  Left voice message   Mickel Fuchs, BSW, [place] Medicaid Team  [phone]

## 2022-09-17 ENCOUNTER — Telehealth: Payer: Self-pay

## 2022-09-17 NOTE — Patient Outreach (Signed)
Care Coordination  09/17/2022  Brenlynn Fake 07/08/2001 [id number]   Transition Care Management Unsuccessful Follow-up Telephone Call  Date of discharge and from where:  09/15/22  Attempts:  2nd Attempt  Reason for unsuccessful TCM follow-up call:  Left voice message  Mickel Fuchs, BSW, [place] Medicaid Team  [phone]

## 2022-09-19 ENCOUNTER — Telehealth: Payer: Self-pay

## 2022-09-19 NOTE — Patient Outreach (Signed)
Care Coordination  09/19/2022  Diara Chaudhari March 30, 2001 [id number]   Transition Care Management Unsuccessful Follow-up Telephone Call  Date of discharge and from where:  09/15/22 [facility]  Attempts:  3rd Attempt  Reason for unsuccessful TCM follow-up call:  Left voice message   Mickel Fuchs, BSW, [place] Medicaid Team  [phone]

## 2022-10-07 ENCOUNTER — Ambulatory Visit (INDEPENDENT_AMBULATORY_CARE_PROVIDER_SITE_OTHER): Payer: Medicaid Other | Admitting: Student

## 2022-10-07 ENCOUNTER — Other Ambulatory Visit (HOSPITAL_COMMUNITY)
Admission: RE | Admit: 2022-10-07 | Discharge: 2022-10-07 | Disposition: A | Discharge status: Home / Self Care | Payer: Medicaid Other | Source: Ambulatory Visit | Attending: Student | Admitting: Student

## 2022-10-07 ENCOUNTER — Encounter: Payer: Self-pay | Admitting: Student

## 2022-10-07 VITALS — BP 115/77 | HR 83 | Ht 66.0 in | Wt 119.3 lb

## 2022-10-07 DIAGNOSIS — R5383 Other fatigue: Secondary | ICD-10-CM | POA: Diagnosis not present

## 2022-10-07 DIAGNOSIS — N923 Ovulation bleeding: Secondary | ICD-10-CM

## 2022-10-07 DIAGNOSIS — G479 Sleep disorder, unspecified: Secondary | ICD-10-CM

## 2022-10-07 DIAGNOSIS — Z3045 Encounter for surveillance of transdermal patch hormonal contraceptive device: Secondary | ICD-10-CM | POA: Diagnosis not present

## 2022-10-07 DIAGNOSIS — R300 Dysuria: Secondary | ICD-10-CM | POA: Insufficient documentation

## 2022-10-07 LAB — POCT URINALYSIS DIPSTICK
Bilirubin, UA: NEGATIVE
Blood, UA: NEGATIVE
Glucose, UA: NEGATIVE
Ketones, UA: NEGATIVE
Leukocytes, UA: NEGATIVE
Nitrite, UA: NEGATIVE
Odor: NEGATIVE
Protein, UA: NEGATIVE
Spec Grav, UA: 1.02 (ref 1.010–1.025)
Urobilinogen, UA: 0.2 E.U./dL
pH, UA: 6 (ref 5.0–8.0)

## 2022-10-07 LAB — POCT URINE PREGNANCY: Preg Test, Ur: NEGATIVE

## 2022-10-07 MED ORDER — NORELGESTROMIN-ETH ESTRADIOL 150-35 MCG/24HR TD PTWK: 1.0000 | MEDICATED_PATCH | TRANSDERMAL | 6 refills | Status: AC

## 2022-10-07 NOTE — Progress Notes (Signed)
New GYN  Previously was on depo from July to mid October of this year.  Currently on Xulane patch and reports cycle started last 10/02/22 Pt states she was previously advised to only put patch on if cycle was heavy and remove it in a day or 2. Pt reports being sexually active, but has hx of +CH last year and received treatment Today pt reports discomfort with urination.

## 2022-10-07 NOTE — Patient Instructions (Addendum)
For trouble sleeping:  try 2-3.5mg  Melatonin at night 200-400mg  Magnesium GLYCONATE at night time. Make sure you purchase the Magnesium GLYCONATE and not Magnesium Citrate. You can try 400mg  at bedtime. If this is causing your stomach to be upset you can try 200mg .  I would recommend finding a Daily Women's Multivitamin to help with your fatigue.   We should see you again in 3 months, or sooner, if your symptoms get worse.   Ethinyl Estradiol; Norelgestromin Patches What is this medication? ETHINYL ESTRADIOL;NORELGESTROMIN (ETH in il es tra DYE ole; nor el JES troe min) prevents ovulation and pregnancy. It belongs to a group of medications called contraceptives. It is a combination of the hormones estrogen and progestin. This medicine may be used for other purposes; ask your health care provider or pharmacist if you have questions. COMMON BRAND NAME(S): Ortho , ZAFEMY What should I tell my care team before I take this medication? They need to know if you have or ever had any of these conditions: Abnormal vaginal bleeding Blood clots Blood vessel disease Breast, cervical, endometrial, ovarian, liver, or uterine cancer Diabetes Gallbladder disease Having surgery Heart disease or recent heart attack High blood pressure High cholesterol or triglycerides History of irregular heartbeat or heart valve problems Kidney disease Liver disease Lupus Migraine headaches Protein C or S deficiency Recently had a baby, miscarriage, or abortion Stroke Tobacco use An unusual or allergic reaction to estrogens, progestins, other medications, foods, dyes, or preservatives Pregnant or trying to get pregnant Breastfeeding How should I use this medication? This patch is applied to the skin. Follow the directions on the prescription label. Apply to clean, dry, healthy skin on the buttock, abdomen, upper outer arm or upper torso, in a place where it will not be rubbed by tight clothing. Do  not use lotions or other cosmetics on the site where the patch will go. Press the patch firmly in place for 10 seconds to ensure good contact with the skin. Change the patch every 7 days on the same day of the week for 3 weeks. You will then have a break from the patch for 1 week, after which you will apply a new patch. Do not use your medication more often than directed. Contact your care team about the use of this medication in children. Special care may be needed. This medication has been used in female children who have started having menstrual periods. A patient package insert for the product will be given with each prescription and refill. Read this sheet carefully each time. The sheet may change frequently. Overdosage: If you think you have taken too much of this medicine contact a poison control center or emergency room at once. NOTE: This medicine is only for you. Do not share this medicine with others. What if I miss a dose? You will need to replace your patch once a week as directed. If your patch is lost or falls off, contact your care team for advice. You may need to use another form of birth control if your patch has been off for more than 1 day. What may interact with this medication? Do not take this medication with the following: Dasabuvir; ombitasvir; paritaprevir; ritonavir Ombitasvir; paritaprevir; ritonavir This medication may also interact with the following: Acetaminophen Antibiotics or medications for infections, especially rifampin, rifabutin, rifapentine, penicillins, or tetracyclines Aprepitant or fosaprepitant Armodafinil Ascorbic acid (vitamin C) Barbiturate medications, such as phenobarbital or primidone Bosentan Certain antivirals for HIV or hepatitis Certain medications for cancer treatment  Certain medications for cholesterol Certain medications for seizures, such as carbamazepine, clobazam, felbamate, lamotrigine, oxcarbazepine, phenytoin, rufinamide, or  topiramate Cyclosporine Dantrolene Elagolix Flibanserin Grapefruit juice Lesinurad Medications for diabetes Medications to treat fungal infections, such as griseofulvin, miconazole, fluconazole, ketoconazole, itraconazole, posaconazole, or voriconazole Mifepristone Mitotane Modafinil Morphine Mycophenolate St. John's wort Tamoxifen Temazepam Theophylline or aminophylline Thyroid hormones Tizanidine Tranexamic acid Ulipristal Warfarin This list may not describe all possible interactions. Give your health care provider a list of all the medicines, herbs, non-prescription drugs, or dietary supplements you use. Also tell them if you smoke, drink alcohol, or use illegal drugs. Some items may interact with your medicine. What should I watch for while using this medication? Visit your care team for regular checks on your progress. You will need a regular breast and pelvic exam and Pap smear while on this medication. Use an additional method of contraception during the first cycle that you use this patch. If you have any reason to think you are pregnant, stop using this medication right away and contact your care team. If you are using this medication for hormone related problems, it may take several cycles of use to see improvement in your condition. Smoking tobacco increases the risk of getting a blood clot or having a stroke while you are taking this medication, especially if you are older than 35 years. This medication can make your body retain fluid, making your fingers, hands, or ankles swell. Your blood pressure can go up. Contact your care team if you feel you are retaining fluid. This medication can make you more sensitive to the sun. Keep out of the sun. If you cannot avoid being in the sun, wear protective clothing and sunscreen. Do not use sun lamps, tanning beds, or tanning booths. If you wear contact lenses and notice visual changes, or if the lenses begin to feel uncomfortable,  consult your eye care specialist. Tenderness, swelling, or minor bleeding of the gums may occur. Talk to your dentist if this happens. Brushing and flossing your teeth regularly may reduce the risk of side effects. Visit your dentist on a regular basis. Tell your dentist about any medications you are taking. If you are going to have elective surgery or an MRI, tell your care team that you are using this medication. You may need to remove the patch before the procedure. Using this medication does not protect you or your partner against HIV or other sexually transmitted infections (STIs). What side effects may I notice from receiving this medication? Side effects that you should report to your care team as soon as possible: Allergic reactions--skin rash, itching, hives, swelling of the face, lips, tongue, or throat Blood clot--pain, swelling, or warmth in the leg, shortness of breath, chest pain Gallbladder problems--severe stomach pain, nausea, vomiting, fever Increase in blood pressure Liver injury--right upper belly pain, loss of appetite, nausea, light-colored stool, dark yellow or brown urine, yellowing skin or eyes, unusual weakness or fatigue New or worsening migraines or headaches Stroke--sudden numbness or weakness of the face, arm, or leg, trouble speaking, confusion, trouble walking, loss of balance or coordination, dizziness, severe headache, change in vision Unusual vaginal discharge, itching, or odor Worsening mood, feelings of depression Side effects that usually do not require medical attention (report to your care team if they continue or are bothersome): Breast pain or tenderness Dark patches of skin on the face or other sun-exposed areas Irregular menstrual cycles or spotting Nausea Weight gain This list may not describe all possible side  effects. Call your doctor for medical advice about side effects. You may report side effects to FDA at 1-800-FDA-1088. Where should I keep my  medication? Keep out of the reach of children and pets. Store at room temperature between 15 and 30 degrees C (59 and 86 degrees F). Keep the patch in its pouch until time of use. Throw away any unused medication after the expiration date. Dispose of used patches properly. Since a used patch may still contain active hormones, fold the patch in half so that it sticks to itself prior to disposal. Throw away in a place where children or pets cannot reach. NOTE: This sheet is a summary. It may not cover all possible information. If you have questions about this medicine, talk to your doctor, pharmacist, or health care provider.  2023 Elsevier/Gold Standard (2021-01-21 00:00:00)

## 2022-10-07 NOTE — Progress Notes (Signed)
History:  Ms. Tara Mckee is a 21 y.o. No obstetric history on file. who presents to clinic today for contraception management and irregular vaginal bleeding.   Patient was previously was on depo and received her last injection in July. States that the Depo caused heavier periods and more intermenstrual bleeding. Intermenstrual bleeding subsided with cessation of Depo. Reports currently using Xulane patch and reports current cycle started last 10/02/22. However, patient also mentions that she was previously advised to only put patch on if cycle was heavy and remove it in a day or 2. Patient would like clarification on this and more regularity to her cycle and bleeding patterns. No c/o of pain, n/v, fatigue, or other peri menstrual symptoms.   Patient reports being sexually active, and history of STI 1 year ago. Received treatment and symptoms resolved.   Patient also reports discomfort with urination that has been ongoing for months. States she was tested for a UTI and this came back negative since onset of symptoms. Patient experiences burning with urination. No increased frequency, odor, blood in urine, back pain, n/v or fever.   Patient mentioned feeling tired often and having a hard time falling asleep. Has tried 10mg  Melatonin, Benadryl, and OTC sleep aid without success.  The following portions of the patient's history were reviewed and updated as appropriate: allergies, current medications, family history, past medical history, social history, past surgical history and problem list.  Review of Systems:  Review of Systems  Constitutional:  Positive for malaise/fatigue. Negative for chills and fever.  Respiratory:  Negative for cough and shortness of breath.   Cardiovascular:  Negative for chest pain and palpitations.  Gastrointestinal:  Negative for abdominal pain, constipation, diarrhea, nausea and vomiting.  Genitourinary:  Positive for dysuria. Negative for flank pain, frequency,  hematuria and urgency.       +vaginal bleeding  Skin: Negative.   Neurological:  Negative for dizziness, weakness and headaches.  Psychiatric/Behavioral:  Negative for depression. The patient is not nervous/anxious.       Objective:  Physical Exam BP 115/77   Pulse 83   Ht 5\' 6"  (1.676 m)   Wt 119 lb 4.8 oz (54.1 kg)   LMP 10/02/2022   BMI 19.26 kg/m  Physical Exam Exam conducted with a chaperone present.  Constitutional:      Appearance: Normal appearance. She is normal weight.  Cardiovascular:     Rate and Rhythm: Normal rate and regular rhythm.  Pulmonary:     Effort: Pulmonary effort is normal.  Abdominal:     General: Abdomen is flat. There is no distension.     Palpations: Abdomen is soft. There is no mass.     Tenderness: There is no abdominal tenderness. There is no guarding.  Genitourinary:    General: Normal vulva.     Exam position: Supine.     Urethra: No prolapse, urethral swelling or urethral lesion.     Vagina: Bleeding present. No vaginal discharge, tenderness or lesions.     Cervix: No discharge, friability, lesion or erythema.     Adnexa:        Right: No tenderness.         Left: No tenderness.    Skin:    General: Skin is warm and dry.     Coloration: Skin is not pale.     Findings: No bruising.  Neurological:     Mental Status: She is alert and oriented to person, place, and time. Mental status is at baseline.  Motor: No weakness.  Psychiatric:        Mood and Affect: Mood normal.        Behavior: Behavior normal.        Thought Content: Thought content normal.        Judgment: Judgment normal.     Labs and Imaging No results found for this or any previous visit (from the past 24 hour(s)).  No results found.  Health Maintenance Due  Topic Date Due   Hepatitis C Screening  Never done   INFLUENZA VACCINE  07/02/2022    Labs, imaging and previous visits in Epic and Care Everywhere reviewed  Assessment & Plan:  1. Initial  encounter for management of contraceptive patch use - Reviewed labeled use of contraceptive patch. Risks and benefits reviewed. Specifically advised patient to avoid tobacco containing products and risks associated with Estrogen therapy. Patient verbalized understanding. Questions were answered. Information was given to patient to review. Plan to follow-up in 3 months, sooner should patient experience pain or swelling in the leg, shortness of breath, chest pain, increase in blood pressure, belly pain, loss of appetite, nausea, vomiting, new or worsening headaches. - POCT urine pregnancy - negative - norelgestromin-ethinyl estradiol [name] 150-35 MCG/24HR transdermal patch; Place 1 patch onto the skin once a week. Apply one(1) patch on first week. Remove this patch after one(1) week and place a new patch. Continue for three(3) weeks. On the fourth (4th) week, remove patch and do not place a new one. After completing one(1) week without a patch, restart placing one(1) new patch per week, for three(3) weeks, and continue the above cycle.  Dispense: 6 patch; Refill: 6  2. Dysuria - UA today was WDL. Exam WDL. Discussed potential for non-infectious cause of symptoms, but hard to be sure based on patient report of history. Collected vaginal swab today to assess for vaginitis as source of symptoms. Plan for patient to return in 3 months if still having discomfort with urination to discuss further management and/or discuss if needing to see a specialist.  - Cervicovaginal ancillary only( [facility]) - POCT Urinalysis Dipstick - WDL  3. Intermenstrual heavy bleeding - Bleeding present today on exam. Discussed that hormonal contraceptive methods can be expected to take 3-12 months before achieving regularity. Due to recently stopping Depo and starting Patch, instructed patient that her body may still be adjusting.  Elected for [facility] today and education on use provided.  Should patient still have irregular bleeding  of unknown cause at follow-up visits, can initiate lab work and imaging. Patient does not desire any baseline lab work today. Plan to assess bleeding pattern in 3 months. - Cervicovaginal ancillary only( ) - POCT urine pregnancy-negative  4. Difficulty sleeping 5. Tiredness - Recommend 2-3.5mg  melatonin and magnesium supplement at night - Discussed healthy sleep hygiene.  - Offered lab work to assess for anemia, vitamin or electrolyte deficiencies, patient does not desire at this time. - Encouraged Primary Care Follow-up   Approximately 50% of total time was spent with this patient on counseling and coordination. The other remaining time was spent performing physical exam.  Johnston Ebbs, NP 10/07/2022 12:25 PM

## 2022-10-08 ENCOUNTER — Other Ambulatory Visit: Payer: Self-pay | Admitting: Student

## 2022-10-08 DIAGNOSIS — B3731 Acute candidiasis of vulva and vagina: Secondary | ICD-10-CM

## 2022-10-08 LAB — CERVICOVAGINAL ANCILLARY ONLY
Bacterial Vaginitis (gardnerella): NEGATIVE
Candida Glabrata: NEGATIVE
Candida Vaginitis: POSITIVE — AB
Chlamydia: NEGATIVE
Comment: NEGATIVE
Comment: NEGATIVE
Comment: NEGATIVE
Comment: NEGATIVE
Comment: NEGATIVE
Comment: NORMAL
Neisseria Gonorrhea: NEGATIVE
Trichomonas: NEGATIVE

## 2022-10-08 MED ORDER — FLUCONAZOLE 150 MG PO TABS: 150.0000 mg | ORAL_TABLET | Freq: Every day | ORAL | 1 refills | Status: DC

## 2022-10-23 ENCOUNTER — Other Ambulatory Visit: Payer: Self-pay

## 2022-10-23 ENCOUNTER — Emergency Department (EMERGENCY_DEPARTMENT_HOSPITAL)
Admission: EM | Admit: 2022-10-23 | Discharge: 2022-10-24 | Disposition: A | Discharge status: Home / Self Care | Payer: Medicaid Other | Source: Home / Self Care | Attending: Emergency Medicine | Admitting: Emergency Medicine

## 2022-10-23 ENCOUNTER — Encounter (HOSPITAL_COMMUNITY): Payer: Self-pay | Admitting: Emergency Medicine

## 2022-10-23 DIAGNOSIS — I959 Hypotension, unspecified: Secondary | ICD-10-CM | POA: Diagnosis not present

## 2022-10-23 DIAGNOSIS — F329 Major depressive disorder, single episode, unspecified: Secondary | ICD-10-CM | POA: Insufficient documentation

## 2022-10-23 DIAGNOSIS — T50902A Poisoning by unspecified drugs, medicaments and biological substances, intentional self-harm, initial encounter: Secondary | ICD-10-CM | POA: Insufficient documentation

## 2022-10-23 DIAGNOSIS — Z1152 Encounter for screening for COVID-19: Secondary | ICD-10-CM | POA: Insufficient documentation

## 2022-10-23 DIAGNOSIS — T887XXA Unspecified adverse effect of drug or medicament, initial encounter: Secondary | ICD-10-CM | POA: Diagnosis not present

## 2022-10-23 DIAGNOSIS — J45909 Unspecified asthma, uncomplicated: Secondary | ICD-10-CM | POA: Insufficient documentation

## 2022-10-23 DIAGNOSIS — R9431 Abnormal electrocardiogram [ECG] [EKG]: Secondary | ICD-10-CM | POA: Diagnosis not present

## 2022-10-23 DIAGNOSIS — R001 Bradycardia, unspecified: Secondary | ICD-10-CM | POA: Diagnosis not present

## 2022-10-23 DIAGNOSIS — R45851 Suicidal ideations: Secondary | ICD-10-CM | POA: Insufficient documentation

## 2022-10-23 DIAGNOSIS — R457 State of emotional shock and stress, unspecified: Secondary | ICD-10-CM | POA: Diagnosis not present

## 2022-10-23 DIAGNOSIS — T50904A Poisoning by unspecified drugs, medicaments and biological substances, undetermined, initial encounter: Secondary | ICD-10-CM | POA: Diagnosis not present

## 2022-10-23 LAB — ACETAMINOPHEN LEVEL: Acetaminophen (Tylenol), Serum: <10 ug/mL — ABNORMAL LOW (ref 10–30)

## 2022-10-23 LAB — COMPREHENSIVE METABOLIC PANEL
ALT: 12 U/L (ref 0–44)
AST: 16 U/L (ref 15–41)
Albumin: 3.7 g/dL (ref 3.5–5.0)
Alkaline Phosphatase: 48 U/L (ref 38–126)
Anion gap: 5 (ref 5–15)
BUN: 14 mg/dL (ref 6–20)
CO2: 24 mmol/L (ref 22–32)
Calcium: 8.6 mg/dL — ABNORMAL LOW (ref 8.9–10.3)
Chloride: 109 mmol/L (ref 98–111)
Creatinine, Ser: 1.07 mg/dL — ABNORMAL HIGH (ref 0.44–1.00)
GFR, Estimated: >60 mL/min (ref >60)
Glucose, Bld: 138 mg/dL — ABNORMAL HIGH (ref 70–99)
Potassium: 3.7 mmol/L (ref 3.5–5.1)
Sodium: 138 mmol/L (ref 135–145)
Total Bilirubin: 0.5 mg/dL (ref 0.3–1.2)
Total Protein: 6.8 g/dL (ref 6.5–8.1)

## 2022-10-23 LAB — CBC WITH DIFFERENTIAL/PLATELET
Abs Immature Granulocytes: 0.01 10*3/uL (ref 0.00–0.07)
Basophils Absolute: 0 10*3/uL (ref 0.0–0.1)
Basophils Relative: 0 %
Eosinophils Absolute: 0.1 10*3/uL (ref 0.0–0.5)
Eosinophils Relative: 2 %
HCT: 37.1 % (ref 36.0–46.0)
Hemoglobin: 12 g/dL (ref 12.0–15.0)
Immature Granulocytes: 0 %
Lymphocytes Relative: 30 %
Lymphs Abs: 1.4 10*3/uL (ref 0.7–4.0)
MCH: 28.6 pg (ref 26.0–34.0)
MCHC: 32.3 g/dL (ref 30.0–36.0)
MCV: 88.3 fL (ref 80.0–100.0)
Monocytes Absolute: 0.3 10*3/uL (ref 0.1–1.0)
Monocytes Relative: 6 %
Neutro Abs: 2.9 10*3/uL (ref 1.7–7.7)
Neutrophils Relative %: 62 %
Platelets: 242 10*3/uL (ref 150–400)
RBC: 4.2 MIL/uL (ref 3.87–5.11)
RDW: 13.3 % (ref 11.5–15.5)
WBC: 4.7 10*3/uL (ref 4.0–10.5)
nRBC: 0 % (ref 0.0–0.2)

## 2022-10-23 LAB — SALICYLATE LEVEL: Salicylate Lvl: <7 mg/dL — ABNORMAL LOW (ref 7.0–30.0)

## 2022-10-23 LAB — ETHANOL: Alcohol, Ethyl (B): <10 mg/dL (ref <10)

## 2022-10-23 MED ORDER — SODIUM CHLORIDE 0.9 % IV BOLUS
500.0000 mL | Freq: Once | INTRAVENOUS | Status: AC
  Administered 2022-10-23: 500 mL via INTRAVENOUS

## 2022-10-23 NOTE — ED Notes (Signed)
Will attempt urine specimen following bolus completion, pt aware.

## 2022-10-23 NOTE — ED Notes (Signed)
Pt resting comfortably with mother a bedside. Pt provided with water and ginger ale per patient request. Will continue to monitor

## 2022-10-23 NOTE — ED Provider Notes (Signed)
[facility]-EMERGENCY DEPT Provider Note   CSN: [id number] Arrival date & time: 10/23/22  1836     History  Chief Complaint  Patient presents with   Suicidal    Myisha Pickerel is a 21 y.o. female.  HPI Patient presents after an overdose in an attempt of self-harm.  Reportedly at 5:00 today took 12 mg total of tizanidine in a suicide attempt.  States she was hoping it would make things better.  Has had previous suicide attempt that was years ago per patient.  Feels a little sleepy at this time.  No pain.  Denies other coingestions and denies any substance use.  Denies pregnancy.    Past Medical History:  Diagnosis Date   Asthma    Eczema     Home Medications Prior to Admission medications   Medication Sig Start Date End Date Taking? Authorizing Provider  ciprofloxacin (CIPRO) 500 MG tablet Take 1 tablet (500 mg total) by mouth every 12 (twelve) hours. Patient not taking: Reported on 10/07/2022 09/11/22   Mesner, Barbara Cower, MD  fluconazole (DIFLUCAN) 150 MG tablet Take 1 tablet (150 mg total) by mouth daily. 10/08/22   Corlis Hove, NP  norelgestromin-ethinyl estradiol Burr Medico) 150-35 MCG/24HR transdermal patch Place 1 patch onto the skin once a week. Apply one(1) patch on first week. Remove this patch after one(1) week and place a new patch. Continue for three(3) weeks. On the fourth (4th) week, remove patch and do not place a new one. After completing one(1) week without a patch, restart placing one(1) new patch per week, for three(3) weeks, and continue the above cycle. 10/07/22   Corlis Hove, NP  ondansetron (ZOFRAN-ODT) 4 MG disintegrating tablet 4mg  ODT q4 hours prn nausea/vomit Patient not taking: Reported on 10/07/2022 09/11/22   Mesner, 11/11/22, MD  promethazine (PHENERGAN) 25 MG tablet Take 1 tablet (25 mg total) by mouth every 6 (six) hours as needed for nausea or vomiting. Patient not taking: Reported on 10/07/2022 09/15/22   Long, 09/17/22, MD   triamcinolone cream (KENALOG) 0.1 % Apply to areas of eczema on body twice a day when needed; do not use on face Patient not taking: Reported on 06/05/2022 02/14/22   02/16/22, MD      Allergies    Molds & smuts    Review of Systems   Review of Systems  Physical Exam Updated Vital Signs BP 114/72   Pulse (!) 57   Temp 97.9 F (36.6 C) (Oral)   Resp 13   Ht 5\' 6"  (1.676 m)   Wt 54.9 kg   LMP 10/02/2022   SpO2 100%   BMI 19.53 kg/m  Physical Exam Vitals and nursing note reviewed.  Eyes:     Pupils: Pupils are equal, round, and reactive to light.  Cardiovascular:     Rate and Rhythm: Regular rhythm.  Pulmonary:     Effort: Pulmonary effort is normal.  Abdominal:     Tenderness: There is no abdominal tenderness.  Musculoskeletal:        General: No tenderness.     Cervical back: Neck supple.  Skin:    General: Skin is warm.  Neurological:     Mental Status: She is alert.     Comments: Awake and appropriate but does appear little sleepy.     ED Results / Procedures / Treatments   Labs (all labs ordered are listed, but only abnormal results are displayed) Labs Reviewed  COMPREHENSIVE METABOLIC PANEL - Abnormal; Notable for the  following components:      Result Value   Glucose, Bld 138 (*)    Creatinine, Ser 1.07 (*)    Calcium 8.6 (*)    All other components within normal limits  ACETAMINOPHEN LEVEL - Abnormal; Notable for the following components:   Acetaminophen (Tylenol), Serum <10 (*)    All other components within normal limits  SALICYLATE LEVEL - Abnormal; Notable for the following components:   Salicylate Lvl <7.0 (*)    All other components within normal limits  CBC WITH DIFFERENTIAL/PLATELET  ETHANOL  RAPID URINE DRUG SCREEN, HOSP PERFORMED  PREGNANCY, URINE  URINALYSIS, ROUTINE W REFLEX MICROSCOPIC    EKG EKG Interpretation  Date/Time:  Wednesday October 23 2022 19:46:55 EST Ventricular Rate:  50 PR Interval:  140 QRS  Duration: 89 QT Interval:  455 QTC Calculation: 415 R Axis:   73 Text Interpretation: Sinus rhythm Confirmed by Benjiman Core [phone]) on 10/23/2022 8:06:50 PM  Radiology No results found.  Procedures Procedures    Medications Ordered in ED Medications - No data to display  ED Course/ Medical Decision Making/ A&P                           Medical Decision Making Amount and/or Complexity of Data Reviewed Labs: ordered.   Patient presents with overdose on tizanidine.  He was in a suicide attempt.  Differential diagnosis does include other coingestants but more focuses on the management of the overdose.  Discussed with poison control.  Can have bradycardia and hypotension.  Also central nervous sedation.  May require stimulation and for hypotension fluids and if that does not work pressors such as nor epi or dopamine.  If bradycardia becomes severe could require atropine.  4-hour monitoring.  Watch for orthostasis however.  We will get basic blood work. Patient is voluntary at this time but I would IVC if she attempts to leave.  Labs reassuring.  Urinalysis urine pregnancy and urine rapid drug screen are still pending.  Just attempted orthostatics on the patient and she felt lightheaded with standing.  Previously discussed with poison control and she will need to be clear of orthostasis before she is medically cleared.  Care will be turned over to Dr. Preston Fleeting        Final Clinical Impression(s) / ED Diagnoses Final diagnoses:  Intentional overdose, initial encounter [facility])    Rx / DC Orders ED Discharge Orders     None         Benjiman Core, MD 10/23/22 2338

## 2022-10-23 NOTE — ED Provider Notes (Signed)
Care assumed from Dr. Rubin Payor, patient with intentional overdose of tizanidine. She needs to have normal orthostatic vital signs before she can be medically cleared for psychiatric evaluation. She is currently voluntary, but will need IVC if she tries to leave.  1:02 AM Patient still hypotensive with standing with blood pressure going down to 73 systolic.  I have ordered additional IV fluids.  2:33 AM Patient no longer has orthostasis, she is medically cleared for psychiatric evaluation.  TTS consultation has been requested.  4:24 AM Patient now stating that she does not want to stay and wants to go home.  TTS consultation is appreciated, patient requires inpatient care because of suicidal thoughts.  Therefore, I am initiating IVC paperwork.  CRITICAL CARE Performed by: Dione Booze Total critical care time: 45 minutes Critical care time was exclusive of separately billable procedures and treating other patients. Critical care was necessary to treat or prevent imminent or life-threatening deterioration. Critical care was time spent personally by me on the following activities: development of treatment plan with patient and/or surrogate as well as nursing, discussions with consultants, evaluation of patient's response to treatment, examination of patient, obtaining history from patient or surrogate, ordering and performing treatments and interventions, ordering and review of laboratory studies, ordering and review of radiographic studies, pulse oximetry and re-evaluation of patient's condition.   Dione Booze, MD 10/24/22 [id number]

## 2022-10-23 NOTE — ED Triage Notes (Signed)
Pt to ER via EMS from home.  Pt reports taking 12mg  of Tizanidine at approximately 5pm.  Pt obtained medication from "a friend" and reports she took the medication in an attempt to harm herself.  Pt has no prior mental health history or prior suicidal attempt.

## 2022-10-24 ENCOUNTER — Inpatient Hospital Stay (HOSPITAL_COMMUNITY)
Admission: AD | Admit: 2022-10-24 | Discharge: 2022-10-28 | DRG: 885 | Disposition: A | Discharge status: Home / Self Care | Payer: Medicaid Other | Source: Intra-hospital | Attending: Psychiatry | Admitting: Psychiatry

## 2022-10-24 ENCOUNTER — Encounter (HOSPITAL_COMMUNITY): Payer: Self-pay | Admitting: Psychiatry

## 2022-10-24 ENCOUNTER — Encounter (HOSPITAL_COMMUNITY): Payer: Self-pay | Admitting: Emergency Medicine

## 2022-10-24 DIAGNOSIS — Z7722 Contact with and (suspected) exposure to environmental tobacco smoke (acute) (chronic): Secondary | ICD-10-CM | POA: Diagnosis present

## 2022-10-24 DIAGNOSIS — J45909 Unspecified asthma, uncomplicated: Secondary | ICD-10-CM | POA: Diagnosis present

## 2022-10-24 DIAGNOSIS — F4010 Social phobia, unspecified: Secondary | ICD-10-CM | POA: Diagnosis present

## 2022-10-24 DIAGNOSIS — R9431 Abnormal electrocardiogram [ECG] [EKG]: Secondary | ICD-10-CM | POA: Diagnosis not present

## 2022-10-24 DIAGNOSIS — Z79899 Other long term (current) drug therapy: Secondary | ICD-10-CM

## 2022-10-24 DIAGNOSIS — G4700 Insomnia, unspecified: Secondary | ICD-10-CM | POA: Diagnosis present

## 2022-10-24 DIAGNOSIS — Z820 Family history of epilepsy and other diseases of the nervous system: Secondary | ICD-10-CM | POA: Diagnosis not present

## 2022-10-24 DIAGNOSIS — F331 Major depressive disorder, recurrent, moderate: Principal | ICD-10-CM | POA: Diagnosis present

## 2022-10-24 DIAGNOSIS — F332 Major depressive disorder, recurrent severe without psychotic features: Principal | ICD-10-CM | POA: Diagnosis present

## 2022-10-24 DIAGNOSIS — F17290 Nicotine dependence, other tobacco product, uncomplicated: Secondary | ICD-10-CM | POA: Diagnosis present

## 2022-10-24 DIAGNOSIS — T428X2A Poisoning by antiparkinsonism drugs and other central muscle-tone depressants, intentional self-harm, initial encounter: Secondary | ICD-10-CM | POA: Diagnosis present

## 2022-10-24 DIAGNOSIS — Z9151 Personal history of suicidal behavior: Secondary | ICD-10-CM

## 2022-10-24 DIAGNOSIS — Z5986 Financial insecurity: Secondary | ICD-10-CM

## 2022-10-24 DIAGNOSIS — F4000 Agoraphobia, unspecified: Secondary | ICD-10-CM | POA: Diagnosis present

## 2022-10-24 DIAGNOSIS — F322 Major depressive disorder, single episode, severe without psychotic features: Secondary | ICD-10-CM

## 2022-10-24 DIAGNOSIS — F1990 Other psychoactive substance use, unspecified, uncomplicated: Secondary | ICD-10-CM | POA: Diagnosis present

## 2022-10-24 DIAGNOSIS — F411 Generalized anxiety disorder: Secondary | ICD-10-CM | POA: Diagnosis present

## 2022-10-24 DIAGNOSIS — R41843 Psychomotor deficit: Secondary | ICD-10-CM | POA: Diagnosis present

## 2022-10-24 DIAGNOSIS — F410 Panic disorder [episodic paroxysmal anxiety] without agoraphobia: Secondary | ICD-10-CM | POA: Diagnosis present

## 2022-10-24 DIAGNOSIS — F1010 Alcohol abuse, uncomplicated: Secondary | ICD-10-CM | POA: Diagnosis present

## 2022-10-24 DIAGNOSIS — F329 Major depressive disorder, single episode, unspecified: Secondary | ICD-10-CM | POA: Insufficient documentation

## 2022-10-24 DIAGNOSIS — Z818 Family history of other mental and behavioral disorders: Secondary | ICD-10-CM | POA: Diagnosis not present

## 2022-10-24 DIAGNOSIS — Z1152 Encounter for screening for COVID-19: Secondary | ICD-10-CM | POA: Diagnosis not present

## 2022-10-24 DIAGNOSIS — L309 Dermatitis, unspecified: Secondary | ICD-10-CM | POA: Diagnosis present

## 2022-10-24 DIAGNOSIS — T50902A Poisoning by unspecified drugs, medicaments and biological substances, intentional self-harm, initial encounter: Secondary | ICD-10-CM | POA: Diagnosis not present

## 2022-10-24 LAB — URINALYSIS, ROUTINE W REFLEX MICROSCOPIC
Bilirubin Urine: NEGATIVE
Glucose, UA: NEGATIVE mg/dL
Hgb urine dipstick: NEGATIVE
Ketones, ur: NEGATIVE mg/dL
Leukocytes,Ua: NEGATIVE
Nitrite: NEGATIVE
Protein, ur: 30 mg/dL — AB
Specific Gravity, Urine: 1.027 (ref 1.005–1.030)
pH: 6 (ref 5.0–8.0)

## 2022-10-24 LAB — RAPID URINE DRUG SCREEN, HOSP PERFORMED
Amphetamines: NOT DETECTED
Barbiturates: NOT DETECTED
Benzodiazepines: NOT DETECTED
Cocaine: NOT DETECTED
Opiates: NOT DETECTED
Tetrahydrocannabinol: NOT DETECTED

## 2022-10-24 LAB — RESP PANEL BY RT-PCR (FLU A&B, COVID) ARPGX2
Influenza A by PCR: NEGATIVE
Influenza B by PCR: NEGATIVE
SARS Coronavirus 2 by RT PCR: NEGATIVE

## 2022-10-24 LAB — PREGNANCY, URINE: Preg Test, Ur: NEGATIVE

## 2022-10-24 MED ORDER — TRAZODONE HCL 50 MG PO TABS
50.0000 mg | ORAL_TABLET | Freq: Every evening | ORAL | Status: DC | PRN
  Administered 2022-10-24 – 2022-10-27 (×4): 50 mg via ORAL
  Filled 2022-10-24 (×4): qty 1

## 2022-10-24 MED ORDER — ALUM & MAG HYDROXIDE-SIMETH 200-200-20 MG/5ML PO SUSP: 30.0000 mL | Freq: Four times a day (QID) | ORAL | Status: DC | PRN

## 2022-10-24 MED ORDER — ALUM & MAG HYDROXIDE-SIMETH 200-200-20 MG/5ML PO SUSP: 30.0000 mL | ORAL | Status: DC | PRN

## 2022-10-24 MED ORDER — MAGNESIUM HYDROXIDE 400 MG/5ML PO SUSP: 30.0000 mL | Freq: Every day | ORAL | Status: DC | PRN

## 2022-10-24 MED ORDER — HYDROXYZINE HCL 25 MG PO TABS
25.0000 mg | ORAL_TABLET | Freq: Three times a day (TID) | ORAL | Status: DC | PRN
  Administered 2022-10-24 – 2022-10-25 (×2): 25 mg via ORAL
  Filled 2022-10-24 (×2): qty 1

## 2022-10-24 MED ORDER — ACETAMINOPHEN 325 MG PO TABS: 650.0000 mg | ORAL_TABLET | Freq: Four times a day (QID) | ORAL | Status: DC | PRN

## 2022-10-24 MED ORDER — HYDROXYZINE HCL 25 MG PO TABS
25.0000 mg | ORAL_TABLET | Freq: Four times a day (QID) | ORAL | Status: DC | PRN
  Administered 2022-10-24: 25 mg via ORAL
  Filled 2022-10-24: qty 1

## 2022-10-24 MED ORDER — ONDANSETRON HCL 4 MG PO TABS: 4.0000 mg | ORAL_TABLET | Freq: Three times a day (TID) | ORAL | Status: DC | PRN

## 2022-10-24 MED ORDER — LACTATED RINGERS IV BOLUS
1000.0000 mL | Freq: Once | INTRAVENOUS | Status: AC
  Administered 2022-10-24: 1000 mL via INTRAVENOUS

## 2022-10-24 MED ORDER — ACETAMINOPHEN 325 MG PO TABS: 650.0000 mg | ORAL_TABLET | ORAL | Status: DC | PRN
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
## 2022-10-24 NOTE — Consult Note (Signed)
[place] ED ASSESSMENT   Reason for Consult:  Overdose Referring Physician:  Delora Fuel, MD Patient Identification: Tara Mckee MRN:  [id number] ED Chief Complaint: MDD (major depressive disorder), single episode  Diagnosis:  Principal Problem:   MDD (major depressive disorder), single episode   ED Assessment Time Calculation: Start Time: 0930 Stop Time: 1000 Total Time in Minutes (Assessment Completion): 30   Subjective:   Tara Mckee is a 21 y.o. female patient with no known psychiatric history who presents to [facility] after a suicide attempt by overdose.  HPI:  Tara Mckee, a 21 year old female, presents to [facility] following a suicide attempt by overdose. She acknowledges taking sleeping medication with the intention of harming herself but had second thoughts afterward. In response, she reached out to her mother, who, in turn, involved her brother who subsequently called EMS. The trigger for this attempt is identified as financial stress. Tara Mckee denies any prior mental health history, familial psychiatric background, or previous encounters with mental health professionals or medications. She also denies a history of suicidal attempts or self-harm practices. Tara Mckee affirms abstinence from illicit substances, including meth, crack/cocaine, nicotine, alcohol, and marijuana, though she mentions a past history of smoking that she has since quit. She resides with her mother, brothers, and an uncle and is currently employed at the [employer]. 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 Despite reporting sleep disturbances due to her second-shift work schedule, she notes improvements in appetite. She denies auditory or visual hallucinations and denies suicidal or homicidal ideations.   On evaluation, the patient's appearance was casual and appropriate for the environment. Her behavior was cooperative. She  communicated clearly, with speech at a normal rate and volume. Her mood was characterized by anxiety and depression and her affect was congruent with her mood. Her thought process was coherent, goal-directed and linear, with logical thought content. There were no indications of delusions or reacting to internal stimuli. The patient denied auditory or visual hallucinations at this time and confirmed the absence of current suicidal or homicidal ideations.   Past Psychiatric History: Denies previous psychiatric history.  Risk to Self or Others: Is the patient at risk to self? Yes Has the patient been a risk to self in the past 6 months? No Has the patient been a risk to self within the distant past? No Is the patient a risk to others? No Has the patient been a risk to others in the past 6 months? No Has the patient been a risk to others within the distant past? No  [place] Scale:  [facility] from 10/23/2022 in [place] DEPT ED from 09/14/2022 in [place] ED from 09/10/2022 in [place] CATEGORY High Risk No Risk No Risk       AIMS:  , , ,  ,   ASAM: ASAM Multidimensional Assessment Summary Dimension 1:  Description of individual's past and current experiences of substance use and withdrawal: NA Dimension 2:  Description of patient's biomedical conditions and  complications: NA Dimension 3:  Description of emotional, behavioral, or cognitive conditions and complications: NA Dimension 4:  Description of Readiness to Change criteria: NA Dimension 5:  Relapse, continued use, or continued problem potential critiera description: NA Dimension 6:  Recovery/Iiving environment criteria description: NA ASAM Recommended Level of Treatment:  (NA)  Substance Abuse:  Alcohol / Drug Use  Pain Medications: See MAR Prescriptions: See MAR Over the Counter: See MAR History of alcohol / drug  use?: No history of alcohol / drug abuse Longest period of sobriety (when/how long): NA Negative Consequences of Use:  (NA) Withdrawal Symptoms: Other (Comment) (NA)  Past Medical History:  Past Medical History:  Diagnosis Date   Asthma    Eczema     Past Surgical History:  Procedure Laterality Date   ear tag     HERNIA REPAIR     Family History:  Family History  Problem Relation Age of Onset   Kidney Stones Mother    Seizures Mother    Migraines Mother    ADD / ADHD Brother    Family Psychiatric  History: Denies family psychiatric history Social History:  Social History   Substance and Sexual Activity  Alcohol Use No   Comment: occas     Social History   Substance and Sexual Activity  Drug Use No    Social History   Socioeconomic History   Marital status: Single    Spouse name: Not on file   Number of children: Not on file   Years of education: Not on file   Highest education level: Not on file  Occupational History   Not on file  Tobacco Use   Smoking status: Never    Passive exposure: Yes   Smokeless tobacco: Never   Tobacco comments:    mom smokes  Vaping Use   Vaping Use: Every day  Substance and Sexual Activity   Alcohol use: No    Comment: occas   Drug use: No   Sexual activity: Yes    Birth control/protection: Patch  Other Topics Concern   Not on file  Social History Narrative   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   She attends [employer].   She lives with both parents.   She has two brothers.   Social Determinants of Health   Financial Resource Strain: Not on file  Food Insecurity: No Food Insecurity (02/05/2021)   Hunger Vital Sign    Worried About Running Out of Food in the Last Year: Never true    Ran Out of Food in the Last Year: Never true  Transportation Needs: Not on file  Physical Activity: Not on file  Stress: Not on file  Social Connections: Not on file   Additional Social History:    Allergies:   Allergies   Allergen Reactions   Molds & Smuts Shortness Of Breath    Labs:  Results for orders placed or performed during the hospital encounter of 10/23/22 (from the past 48 hour(s))  Comprehensive metabolic panel     Status: Abnormal   Collection Time: 10/23/22  8:13 PM  Result Value Ref [place]   Sodium 138 135 - 145 mmol/L   Potassium 3.7 3.5 - 5.1 mmol/L   Chloride 109 98 - 111 mmol/L   CO2 24 22 - 32 mmol/L   Glucose, Bld 138 (H) 70 - 99 mg/dL    Comment: Glucose reference range applies only to samples taken after fasting for at least 8 hours.   BUN 14 6 - 20 mg/dL   Creatinine, Ser 6.26 (H) 0.44 - 1.00 mg/dL   Calcium 8.6 (L) 8.9 - 10.3 mg/dL   Total Protein 6.8 6.5 - 8.1 g/dL   Albumin 3.7 3.5 - 5.0 g/dL   AST 16 15 - 41 U/L   ALT 12 0 - 44 U/L   Alkaline Phosphatase  48 38 - 126 U/L   Total Bilirubin 0.5 0.3 - 1.2 mg/dL   GFR, Estimated >60 >60 mL/min    Comment: (NOTE) Calculated using the CKD-EPI Creatinine Equation (2021)    Anion gap 5 5 - 15    Comment: Performed at [facility], [place] [address], [place], [place] [address]  CBC with Differential     Status: None   Collection Time: 10/23/22  8:13 PM  Result Value Ref [place]   WBC 4.7 4.0 - 10.5 K/uL   RBC 4.20 3.87 - 5.11 MIL/uL   Hemoglobin 12.0 12.0 - 15.0 g/dL   HCT 37.1 36.0 - 46.0 %   MCV 88.3 80.0 - 100.0 fL   MCH 28.6 26.0 - 34.0 pg   MCHC 32.3 30.0 - 36.0 g/dL   RDW 13.3 11.5 - 15.5 %   Platelets 242 150 - 400 K/uL   nRBC 0.0 0.0 - 0.2 %   Neutrophils Relative % 62 %   Neutro Abs 2.9 1.7 - 7.7 K/uL   Lymphocytes Relative 30 %   Lymphs Abs 1.4 0.7 - 4.0 K/uL   Monocytes Relative 6 %   Monocytes Absolute 0.3 0.1 - 1.0 K/uL   Eosinophils Relative 2 %   Eosinophils Absolute 0.1 0.0 - 0.5 K/uL   Basophils Relative 0 %   Basophils Absolute 0.0 0.0 - 0.1 K/uL   Immature Granulocytes 0 %   Abs Immature Granulocytes 0.01 0.00 - 0.07 K/uL    Comment: Performed at [facility],  [place] [address] [place], [place] [address]  Ethanol     Status: None   Collection Time: 10/23/22  8:13 PM  Result Value Ref [place]   Alcohol, Ethyl (B) <10 <10 mg/dL    Comment: (NOTE) Lowest detectable limit for serum alcohol is 10 mg/dL.  For medical purposes only. Performed at [facility], [place] [address], [place], [place] [address]   Acetaminophen level     Status: Abnormal   Collection Time: 10/23/22  8:13 PM  Result Value Ref [place]   Acetaminophen (Tylenol), Serum <10 (L) 10 - 30 ug/mL    Comment: (NOTE) Therapeutic concentrations vary significantly. A range of 10-30 ug/mL  may be an effective concentration for many patients. However, some  are best treated at concentrations outside of this range. Acetaminophen concentrations >150 ug/mL at 4 hours after ingestion  and >50 ug/mL at 12 hours after ingestion are often associated with  toxic reactions.  Performed at [facility], [place] [address] [place], [place] [id number]   Salicylate level     Status: Abnormal   Collection Time: 10/23/22  8:13 PM  Result Value Ref [place]   Salicylate Lvl Q000111Q (L) 7.0 - 30.0 mg/dL    Comment: Performed at [facility], [place] [address], [place], [place] [address]  Urine rapid drug screen (hosp performed)     Status: None   Collection Time: 10/24/22  1:32 AM  Result Value Ref [place]   Opiates NONE DETECTED NONE DETECTED   Cocaine NONE DETECTED NONE DETECTED   Benzodiazepines NONE DETECTED NONE DETECTED   Amphetamines NONE DETECTED NONE DETECTED   Tetrahydrocannabinol NONE DETECTED NONE DETECTED   Barbiturates NONE DETECTED NONE DETECTED    Comment: (NOTE) DRUG SCREEN FOR MEDICAL PURPOSES ONLY.  IF CONFIRMATION IS NEEDED FOR ANY PURPOSE, NOTIFY LAB WITHIN 5 DAYS.  LOWEST DETECTABLE LIMITS FOR URINE DRUG SCREEN Drug Class  Cutoff (ng/mL) Amphetamine and metabolites    1000 Barbiturate and metabolites     200 Benzodiazepine                 200 Opiates and metabolites        300 Cocaine and metabolites        300 THC                            50 Performed at [facility], [address] [place], [place] [address]   Pregnancy, urine     Status: None   Collection Time: 10/24/22  1:33 AM  Result Value Ref [place]   Preg Test, Ur NEGATIVE NEGATIVE    Comment:        THE SENSITIVITY OF THIS METHODOLOGY IS >20 mIU/mL. Performed at [facility], [address], [place], [place] [address]   Urinalysis, Routine w reflex microscopic     Status: Abnormal   Collection Time: 10/24/22  1:33 AM  Result Value Ref [place]   Color, Urine YELLOW YELLOW   APPearance CLEAR CLEAR   Specific Gravity, Urine 1.027 1.005 - 1.030   pH 6.0 5.0 - 8.0   Glucose, UA NEGATIVE NEGATIVE mg/dL   Hgb urine dipstick NEGATIVE NEGATIVE   Bilirubin Urine NEGATIVE NEGATIVE   Ketones, ur NEGATIVE NEGATIVE mg/dL   Protein, ur 30 (A) NEGATIVE mg/dL   Nitrite NEGATIVE NEGATIVE   Leukocytes,Ua NEGATIVE NEGATIVE   RBC / HPF 0-5 0 - 5 RBC/hpf   WBC, UA 0-5 0 - 5 WBC/hpf   Bacteria, UA RARE (A) NONE SEEN   Squamous Epithelial / LPF 0-5 0 - 5   Mucus PRESENT     Comment: Performed at [facility], [address] [place], [place] [address]  Resp Panel by RT-PCR (Flu A&B, Covid) Anterior Nasal Swab     Status: None   Collection Time: 10/24/22  2:51 AM   Specimen: Anterior Nasal Swab  Result Value Ref [place]   SARS Coronavirus 2 by RT PCR NEGATIVE NEGATIVE    Comment: (NOTE) SARS-CoV-2 target nucleic acids are NOT DETECTED.  The SARS-CoV-2 RNA is generally detectable in upper respiratory specimens during the acute phase of infection. The lowest concentration of SARS-CoV-2 viral copies this assay can detect is 138 copies/mL. A negative result does not preclude SARS-Cov-2 infection and should not be used as the sole basis for treatment or other patient management  decisions. A negative result may occur with  improper specimen collection/handling, submission of specimen other than nasopharyngeal swab, presence of viral mutation(s) within the areas targeted by this assay, and inadequate number of viral copies(<138 copies/mL). A negative result must be combined with clinical observations, patient history, and epidemiological information. The expected result is Negative.  Fact Sheet for Patients:  BloggerCourse.com  Fact Sheet for Healthcare Providers:  SeriousBroker.it  This test is no t yet approved or cleared by the [place] FDA and  has been authorized for detection and/or diagnosis of SARS-CoV-2 by FDA under an Emergency Use Authorization (EUA). This EUA will remain  in effect (meaning this test can be used) for the duration of the COVID-19 declaration under Section 564(b)(1) of the Act, 21 U.S.C.section 360bbb-3(b)(1), unless the authorization is terminated  or revoked sooner.       Influenza A by PCR NEGATIVE NEGATIVE   Influenza B by PCR NEGATIVE NEGATIVE    Comment: (NOTE) The Xpert  Xpress SARS-CoV-2/FLU/RSV plus assay is intended as an aid in the diagnosis of influenza from Nasopharyngeal swab specimens and should not be used as a sole basis for treatment. Nasal washings and aspirates are unacceptable for Xpert Xpress SARS-CoV-2/FLU/RSV testing.  Fact Sheet for Patients: EntrepreneurPulse.com.au  Fact Sheet for Healthcare Providers: IncredibleEmployment.be  This test is not yet approved or cleared by the [place] FDA and has been authorized for detection and/or diagnosis of SARS-CoV-2 by FDA under an Emergency Use Authorization (EUA). This EUA will remain in effect (meaning this test can be used) for the duration of the COVID-19 declaration under Section 564(b)(1) of the Act, 21 U.S.C. section 360bbb-3(b)(1), unless the authorization  is terminated or revoked.  Performed at [facility], [place] [address] [place], [place] [address]     Current Facility-Administered Medications  Medication Dose Route Frequency Provider Last Rate Last Admin   acetaminophen (TYLENOL) tablet 650 mg  650 mg Oral [id number] PRN Delora Fuel, MD       alum & mag hydroxide-simeth (MAALOX/MYLANTA) 200-200-20 MG/5ML suspension 30 mL  30 mL Oral [id number] PRN Delora Fuel, MD       hydrOXYzine (ATARAX) tablet 25 mg  25 mg Oral [id number] PRN Delora Fuel, MD   25 mg at 10/24/22 0509   ondansetron (ZOFRAN) tablet 4 mg  4 mg Oral [id number] PRN Delora Fuel, MD       Current Outpatient Medications  Medication Sig Dispense Refill   norelgestromin-ethinyl estradiol Tara Mckee) 150-35 MCG/24HR transdermal patch Place 1 patch onto the skin once a week. Apply one(1) patch on first week. Remove this patch after one(1) week and place a new patch. Continue for three(3) weeks. On the fourth (4th) week, remove patch and do not place a new one. After completing one(1) week without a patch, restart placing one(1) new patch per week, for three(3) weeks, and continue the above cycle. 6 patch 6    Musculoskeletal: Strength & Muscle Tone: within normal limits Gait & Station: normal Patient leans: N/A   Psychiatric Specialty Exam: Presentation  General Appearance:  Casual  Eye Contact: Fair  Speech: Clear and Coherent; Normal Rate  Speech Volume: Normal  Handedness:No data recorded  Mood and Affect  Mood: Anxious; Depressed  Affect: Congruent   Thought Process  Thought Processes: Coherent; Linear; Goal Directed  Descriptions of Associations:Intact  Orientation:Full (Time, Place and Person)  Thought Content:Logical  History of Schizophrenia/Schizoaffective disorder:No  Duration of Psychotic Symptoms:No data recorded Hallucinations:Hallucinations: None  Ideas of Reference:None  Suicidal Thoughts:Suicidal Thoughts: No  Homicidal  Thoughts:Homicidal Thoughts: No   Sensorium  Memory: Immediate Good; Recent Good; Remote Good  Judgment: Fair  Insight: Fair   Community education officer  Concentration: Good  Attention Span: Good  Recall: Good  Fund of Knowledge: Good  Language: Good   Psychomotor Activity  Psychomotor Activity: Psychomotor Activity: Normal   Assets  Assets: Communication Skills; Desire for Improvement; Physical Health; Housing; Financial Resources/Insurance    Sleep  Sleep: Sleep: Fair   Physical Exam: Physical Exam Constitutional:      General: She is not in acute distress.    Appearance: She is not ill-appearing, toxic-appearing or diaphoretic.  HENT:     Right Ear: External ear normal.     Left Ear: External ear normal.  Eyes:     General:        Right eye: No discharge.        Left eye: No discharge.  Cardiovascular:     Rate and  Rhythm: Normal rate.  Pulmonary:     Effort: Pulmonary effort is normal. No respiratory distress.  Musculoskeletal:        General: Normal range of motion.     Cervical back: Normal range of motion.  Neurological:     Mental Status: She is alert and oriented to person, place, and time.  Psychiatric:        Mood and Affect: Mood is anxious and depressed.        Behavior: Behavior is cooperative.        Thought Content: Thought content is not paranoid. Thought content does not include homicidal or suicidal ideation.    Review of Systems  Constitutional:  Negative for chills, fever and weight loss.  Respiratory:  Negative for cough and shortness of breath.   Cardiovascular:  Negative for chest pain.  Gastrointestinal:  Negative for diarrhea, nausea and vomiting.  Psychiatric/Behavioral:  Positive for depression and suicidal ideas. Negative for hallucinations, memory loss and substance abuse. The patient is nervous/anxious and has insomnia.    Blood pressure 107/79, pulse 70, temperature 98.8 F (37.1 C), temperature source Oral,  resp. rate 18, height 5\' 6"  (1.676 m), weight 54.9 kg, last menstrual period 10/02/2022, SpO2 100 %. Body mass index is 19.53 kg/m.  Medical Decision Making: Tara Mckee is a 21 y.o. female patient with no known psychiatric history who presents to [facility] after a suicide attempt by overdose. Patient was evaluated earlier today by TTS and recommended for inpatient psychiatric treatment. Discussed disposition and process in detail with the patient. She is amenable to inpatient psychiatric treatment. Will continue to seek inpatient bed placement.  Problem 1: Suicide Attempt  Problem 2: MDD,Single episode    Disposition: Recommend psychiatric Inpatient admission when medically cleared.  Rozetta Nunnery, NP 10/24/2022 10:03 AM

## 2022-10-24 NOTE — Progress Notes (Signed)
Psychoeducational Group Note  Date:  10/24/2022 Time:  2058  Group Topic/Focus:  Wrap-Up Group:   The focus of this group is to help patients review their daily goal of treatment and discuss progress on daily workbooks.  Participation Level: Did Not Attend  Participation Quality:  Not Applicable  Affect:  Not Applicable  Cognitive:  Not Applicable  Insight:  Not Applicable  Engagement in Group: Not Applicable  Additional Comments:  The patient did not attend group this evening.   Hazle Coca S 10/24/2022, 8:58 PM

## 2022-10-24 NOTE — ED Notes (Signed)
Poison control called, cleared and closed case.

## 2022-10-24 NOTE — ED Notes (Signed)
Attempted to call report to [facility]; RN is unavailable and will return call.

## 2022-10-24 NOTE — Progress Notes (Signed)
Pt was accepted to [facility] TODAY 10/24/22; BED Assignment 402-1  Pt meets inpatient criteria per Nira Conn, NP  Attending Physician will be Dr. Sherron Flemings  Report can be called to: Adult unit: [phone]  Pt can arrive after 4:00pm  Care Team notified: [facility], RN, Celesta Gentile, Katheran James, RN, [address]  [place], [place] 10/24/2022 @ 2:12 PM

## 2022-10-24 NOTE — BH Assessment (Signed)
Clinician messaged Freda Jackson, EMT-P: "Hey. It's Trey with TTS. Is the pt able to engage in the assessment, if so the pt will need to be placed in a private room. Is the pt under IVC? Also is the pt medically cleared?"   Clinician awaiting response.    Redmond Pulling, MS, [facility], [facility] Triage Specialist [phone]

## 2022-10-24 NOTE — ED Provider Notes (Signed)
Patient accepted to behavioral health [facility] by Dr. Sherron Flemings.   BP 128/84 (BP Location: Left Arm)   Pulse 80   Temp 98.8 F (37.1 C) (Oral)   Resp 18   Ht 5\' 6"  (1.676 m)   Wt 54.9 kg   LMP 10/02/2022   SpO2 100%   BMI 19.53 kg/m     13/12/2021, MD 10/24/22 1612

## 2022-10-24 NOTE — ED Notes (Signed)
Notified EDP regarding pt's orthostatic hypotension.

## 2022-10-24 NOTE — Tx Team (Signed)
Initial Treatment Plan 10/24/2022 7:10 PM Tara Mckee [id number]    PATIENT STRESSORS: Educational concerns   Financial difficulties   Substance abuse     PATIENT STRENGTHS: Manufacturing systems engineer  Physical Health  Supportive family/friends    PATIENT IDENTIFIED PROBLEMS: Depression  Suicidal attempt  Anxiety                 DISCHARGE CRITERIA:  Ability to meet basic life and health needs Adequate post-discharge living arrangements Improved stabilization in mood, thinking, and/or behavior  PRELIMINARY DISCHARGE PLAN: Attend aftercare/continuing care group  PATIENT/FAMILY INVOLVEMENT: This treatment plan has been presented to and reviewed with the patient, Tara Mckee.  The patient has been given the opportunity to ask questions and make suggestions.  Garnette Scheuermann, RN 10/24/2022, 7:10 PM

## 2022-10-24 NOTE — Progress Notes (Signed)
Pt involuntarily admitted at [facility] for SI attempt.  Pt said she got a hold of someone's Zanaflex (did not want to say whose medication it was) and took 3 pills in an attempt to end her life.  Pt says she now wishes she didn't act impulsively and wishes she could be home with family.  Pt says she is super stressed out with school and finances.  She has had to take the semester of school off but plans to re-enroll in January.  Pt attends a college in [place], [place].  Pt studying criminal justice.  Pt tearful at times throughout discussion.  Pt says she wants to learn how to cope better with stress and to learn out to "open up to my family more - I try and stay strong to protect them."  Pt signed admission paperwork and verbalized understanding of plan of care.  Pt oriented to unit, staff, and room.

## 2022-10-24 NOTE — ED Notes (Signed)
TTS at bedside. 

## 2022-10-24 NOTE — Progress Notes (Signed)
   10/24/22 2131  Psych Admission Type (Psych Patients Only)  Admission Status Involuntary  Psychosocial Assessment  Patient Complaints Depression;Anxiety  Eye Contact Brief  Facial Expression Anxious  Affect Anxious;Depressed  Speech Soft  Interaction Guarded  Motor Activity Other (Comment) (wdl)  Appearance/Hygiene Unremarkable  Behavior Characteristics Cooperative  Mood Depressed;Anxious  Thought Process  Coherency WDL  Content WDL  Delusions None reported or observed  Perception WDL  Hallucination None reported or observed  Judgment Limited  Confusion None  Danger to Self  Current suicidal ideation? Denies  Danger to Others  Danger to Others None reported or observed   D: Patient did not want to engage with peers reports feeling anxious. Pt appeared shaky and crying. Pt stated she is worried about losing her job.   A: Medications administered as prescribed. Support and encouragement provided as needed.  R: Patient remains safe on the unit. Plan of care ongoing for safety and stability.

## 2022-10-25 DIAGNOSIS — F411 Generalized anxiety disorder: Secondary | ICD-10-CM | POA: Insufficient documentation

## 2022-10-25 LAB — COMPREHENSIVE METABOLIC PANEL
ALT: 11 U/L (ref 0–44)
AST: 14 U/L — ABNORMAL LOW (ref 15–41)
Albumin: 3.9 g/dL (ref 3.5–5.0)
Alkaline Phosphatase: 51 U/L (ref 38–126)
Anion gap: 6 (ref 5–15)
BUN: 14 mg/dL (ref 6–20)
CO2: 24 mmol/L (ref 22–32)
Calcium: 8.8 mg/dL — ABNORMAL LOW (ref 8.9–10.3)
Chloride: 107 mmol/L (ref 98–111)
Creatinine, Ser: 0.88 mg/dL (ref 0.44–1.00)
GFR, Estimated: >60 mL/min (ref >60)
Glucose, Bld: 97 mg/dL (ref 70–99)
Potassium: 4.5 mmol/L (ref 3.5–5.1)
Sodium: 137 mmol/L (ref 135–145)
Total Bilirubin: 0.4 mg/dL (ref 0.3–1.2)
Total Protein: 7 g/dL (ref 6.5–8.1)

## 2022-10-25 LAB — CBC
HCT: 39.7 % (ref 36.0–46.0)
Hemoglobin: 12.5 g/dL (ref 12.0–15.0)
MCH: 27.9 pg (ref 26.0–34.0)
MCHC: 31.5 g/dL (ref 30.0–36.0)
MCV: 88.6 fL (ref 80.0–100.0)
Platelets: 270 10*3/uL (ref 150–400)
RBC: 4.48 MIL/uL (ref 3.87–5.11)
RDW: 13.2 % (ref 11.5–15.5)
WBC: 4.8 10*3/uL (ref 4.0–10.5)
nRBC: 0 % (ref 0.0–0.2)

## 2022-10-25 LAB — LIPID PANEL
Cholesterol: 143 mg/dL (ref 0–200)
HDL: 66 mg/dL (ref >40)
LDL Cholesterol: 57 mg/dL (ref 0–99)
Total CHOL/HDL Ratio: 2.2 RATIO
Triglycerides: 100 mg/dL (ref <150)
VLDL: 20 mg/dL (ref 0–40)

## 2022-10-25 LAB — TSH: TSH: 0.721 u[IU]/mL (ref 0.350–4.500)

## 2022-10-25 MED ORDER — SERTRALINE HCL 25 MG PO TABS
25.0000 mg | ORAL_TABLET | Freq: Every day | ORAL | Status: AC
  Administered 2022-10-25 – 2022-10-26 (×2): 25 mg via ORAL
  Filled 2022-10-25 (×3): qty 1

## 2022-10-25 MED ORDER — SERTRALINE HCL 50 MG PO TABS
50.0000 mg | ORAL_TABLET | Freq: Every day | ORAL | Status: DC
  Administered 2022-10-27 – 2022-10-28 (×2): 50 mg via ORAL
  Filled 2022-10-25 (×3): qty 1

## 2022-10-25 NOTE — Progress Notes (Signed)
   10/25/22 2211  Psych Admission Type (Psych Patients Only)  Admission Status Involuntary  Psychosocial Assessment  Patient Complaints Depression;Anxiety  Eye Contact Fair  Facial Expression Anxious  Affect Depressed  Speech Logical/coherent  Interaction Guarded  Motor Activity Other (Comment) (wdl)  Appearance/Hygiene Unremarkable  Behavior Characteristics Cooperative  Mood Depressed  Thought Process  Coherency WDL  Content WDL  Delusions None reported or observed  Perception WDL  Hallucination None reported or observed  Judgment Impaired  Confusion None  Danger to Self  Current suicidal ideation? Denies  Danger to Others  Danger to Others None reported or observed   D: Patient calm and cooperative. Affect a lot brighter than yesterday. Pt observed in dayroom playing cards and interacting well with peers.  A: Medications administered as prescribed. Support and encouragement provided as needed.  R: Patient remains safe on the unit. Plan of care ongoing.

## 2022-10-25 NOTE — Progress Notes (Signed)
Pt tearful this morning, concerned about getting her phone to inform work ([employer]) via [employer] application that pt is not able to be at work due to pt's hospitalization.  Family supposed to bring in pt's phone this evening.  Pt is guarded on approach and soft spoken.  She denies SI/HI/AVH and wishes she could be home.  RN will continue to monitor pt's progress and intervene as needed.

## 2022-10-25 NOTE — [facility] Suicide Risk Assessment (Addendum)
Suicide Risk Assessment  Admission Assessment    [facility]   Nursing information obtained from:  Patient Demographic factors:  Adolescent or young adult Current Mental Status:  NA Loss Factors:  NA Historical Factors:  Impulsivity Risk Reduction Factors:  Positive social support, Employed  Total Time spent with patient: 1 hour Principal Problem: MDD (major depressive disorder), recurrent severe, without psychosis (HCC) Diagnosis:  Principal Problem:   MDD (major depressive disorder), recurrent severe, without psychosis (HCC) Active Problems:   Generalized anxiety disorder  Subjective Data:   Tara Mckee is a 21 year old female with no significant past psychiatric history who was involuntarily [facility] due to suicide attempt via drug overdose by ingesting 3 pills of tizanidine.   Patient reports that her suicide attempt was triggered by financial stressors.  She reports that she attempted 3 days ago.  Patient reports that she took 3 pills of tizanidine which she got access to from her home.  After attempting, patient reports that she immediately called her mother, who in turn, contacted her brother who then called EMS.  Prior to her suicide attempt, patient reports that she has been having suicidal thoughts off and on but never considered attempting until 3 days ago.  Patient reports that she is unsure of how long she has been having suicidal thoughts.   In addition to her suicidal thoughts, patient endorses depression that she has been dealing with for years.  Patient her depression to financial stress.  Patient endorses the following depressive symptoms: insomnia, low mood, hopelessness, weight loss, decreased appetite, psychomotor retardation, and suicidal thoughts.  Patient depressive symptoms are worsened by lack of money.  She states that her depression is alleviated by friends and family.  Prior to her  admission to this facility, patient reports that she was receiving on average 5 hours of sleep a night.  She reports that her sleep was characterized by waking up periodically.  Patient reports that she has no problems with falling asleep but states that she often finds herself waking up at 2 in the morning.  Patient reports that she utilizes Benadryl and a sleep aid to help her sleep.  Patient endorses anxiety and rates her anxiety at 10 out of 10.  Patient reports that her anxiety is characterized by the following: agoraphobia, excessive worrying, social anxiety.  Patient endorses panic attacks and states that her last panic attack was last night.  She reports that her panic attack was triggered by being admitted to this facility.  Patient's panic attacks are characterized by the following symptoms: difficulty breathing, increased heart rate, lightheadedness, and feeling like there is an elephant on her chest.  Patient denies past history of manic episode.  Patient denies history of psychosis prior to her admission.   Patient denies a past history of hospitalization due to mental health.  Patient denies prior history of suicide attempt and denies history of psychiatric medication use.  Patient reports that she is sad and her biggest issue is wanting to go home.  Patient rates her current depression an 8 out of 10 with 10 being most severe.  She reports that her depression has worsened due to being admitted.  Patient also endorses anxiety and rates her anxiety a 10 out of 10.  Patient denies suicidal or homicidal ideations.  She further denies auditory or visual hallucinations and does not appear to be responding to internal/external stimuli.  Patient denies paranoia or delusional  thoughts.  Patient reports that her sleep was not good last night, even with the use of trazodone.  She reports her appetite was good yesterday but states that she has not eaten anything today.   Patient is casually dressed, sitting  in bed, and alert and oriented x 4.  Patient is tearful during the assessment but is cooperative and able to answer all questions addressed to her.  Patient maintained good eye contact.  Patient's speech was clear, coherent, and with normal rate.  Patient's thought process is organized.  Patient's thought content is coherent.  Patient endorses depression and anxiety with congruent affect.  Patient denies suicidal ideations.  Patient does not appear to be responding to internal/external stimuli.  Continued Clinical Symptoms:   Patient continues to endorse depression and anxiety made worse by being currently admitted to [facility].  Denies suicidal or homicidal ideations.  She denies psychosis and does not appear to be responding to internal/external stimuli.  Although patient expresses concerns with being admitted, patient is agreeable to starting medication for the management of her depression and anxiety.  Alcohol Use Disorder Identification Test Final Score (AUDIT): 3 The "Alcohol Use Disorders Identification Test", Guidelines for Use in Primary Care, Second Edition.  World Science writer [facility]). Score between 0-7:  no or low risk or alcohol related problems. Score between 8-15:  moderate risk of alcohol related problems. Score between 16-19:  high risk of alcohol related problems. Score 20 or above:  warrants further diagnostic evaluation for alcohol dependence and treatment.   CLINICAL FACTORS:   Severe Anxiety and/or Agitation Depression:   Hopelessness Impulsivity Insomnia Severe   Musculoskeletal: Strength & Muscle Tone: within normal limits Gait & Station: normal Patient leans: N/A  Psychiatric Specialty Exam:  Presentation  General Appearance:  Appropriate for Environment; Casual  Eye Contact: Fair  Speech: Clear and Coherent; Normal Rate  Speech Volume: Normal  Handedness: Right   Mood and Affect  Mood: Anxious; Depressed  Affect: Congruent;  Tearful   Thought Process  Thought Processes: Coherent; Goal Directed  Descriptions of Associations:Intact  Orientation:Full (Time, Place and Person)  Thought Content:Logical  History of Schizophrenia/Schizoaffective disorder:No  Duration of Psychotic Symptoms:No data recorded Hallucinations:Hallucinations: None  Ideas of Reference:None  Suicidal Thoughts:Suicidal Thoughts: No  Homicidal Thoughts:Homicidal Thoughts: No   Sensorium  Memory: Immediate Good; Recent Good; Remote Good  Judgment: Fair  Insight: Fair   Art therapist  Concentration: Good  Attention Span: Good  Recall: Good  Fund of Knowledge: Good  Language: Good   Psychomotor Activity  Psychomotor Activity: Psychomotor Activity: Normal   Assets  Assets: Communication Skills; Desire for Improvement; Financial Resources/Insurance; Housing; Physical Health; Resilience; Social Support; Vocational/Educational   Sleep  Sleep: Sleep: Fair    Physical Exam: Physical Exam Constitutional:      Appearance: Normal appearance.  HENT:     Head: Normocephalic and atraumatic.     Nose: Nose normal.     Mouth/Throat:     Mouth: Mucous membranes are moist.  Eyes:     Extraocular Movements: Extraocular movements intact.     Pupils: Pupils are equal, round, and reactive to light.  Cardiovascular:     Rate and Rhythm: Normal rate and regular rhythm.  Pulmonary:     Effort: Pulmonary effort is normal.     Breath sounds: Normal breath sounds.  Abdominal:     General: Abdomen is flat.  Musculoskeletal:        General: Normal range of motion.  Cervical back: Normal range of motion and neck supple.  Skin:    General: Skin is warm and dry.  Neurological:     General: No focal deficit present.     Mental Status: She is alert and oriented to person, place, and time.  Psychiatric:        Attention and Perception: Attention and perception normal. She does not perceive auditory or  visual hallucinations.        Mood and Affect: Mood is anxious and depressed. Affect is tearful.        Speech: Speech normal.        Behavior: Behavior is agitated. Behavior is cooperative.        Thought Content: Thought content normal. Thought content is not paranoid or delusional. Thought content does not include homicidal or suicidal ideation.        Cognition and Memory: Cognition and memory normal.        Judgment: Judgment normal.    Review of Systems  Constitutional: Negative.   HENT: Negative.    Eyes: Negative.   Respiratory: Negative.    Cardiovascular: Negative.   Gastrointestinal: Negative.   Skin: Negative.   Neurological: Negative.   Psychiatric/Behavioral:  Positive for depression. Negative for hallucinations, substance abuse and suicidal ideas. The patient is nervous/anxious. The patient does not have insomnia.    Blood pressure 133/87, pulse 78, temperature 98.6 F (37 C), temperature source Oral, resp. rate 18, height 5\' 6"  (1.676 m), weight 56.2 kg, last menstrual period 10/02/2022, SpO2 100 %. Body mass index is 20.01 kg/m.   COGNITIVE FEATURES THAT CONTRIBUTE TO RISK:  None    SUICIDE RISK:   Severe:  Frequent, intense, and enduring suicidal ideation, specific plan, no subjective intent, but some objective markers of intent (i.e., choice of lethal method), the method is accessible, some limited preparatory behavior, evidence of impaired self-control, severe dysphoria/symptomatology, multiple risk factors present, and few if any protective factors, particularly a lack of social support.  PLAN OF CARE:   Plan:   Patient is admitted to [facility] due to attempted suicide via drug overdose by the ingestion of 3 tizanidine pills.  Patient denied a prior history of suicide attempts.  Prior to the assessment, patient was extremely tearful and vocalized not wanting to be admitted and wanting to go home.  Provider explained to patient that she is involuntarily admitted  to this facility and would not be able to be discharged today. After explaining her situation, patient appeared understanding.  Patient endorsed depression and anxiety but denied suicidal ideations.  Patient further denied psychosis at this time and did not appear to be responding to internal/external stimuli.  Patient was agreeable to start medication for the management of her major depressive disorder and generalized anxiety disorder.  Patient to be started on sertraline 25 mg for the next 2 days, followed by 50 mg daily.   Safety and Monitoring: Voluntary admission to inpatient psychiatric unit for safety, stabilization and treatment Daily contact with patient to assess and evaluate symptoms and progress in treatment Patient's case to be discussed in multi-disciplinary team meeting Observation Level : q15 minute checks Vital signs: q12 hours Precautions: safety   Diagnosis:  Principal Problem:   MDD (major depressive disorder), recurrent severe, without psychosis (HCC) Active Problems:   Generalized anxiety disorder   #Major depressive disorder, recurrent severe, without psychosis -Start sertraline 25 mg for 2 days, then increase to 50 mg starting Sunday (10/27/2022) for depression   #Generalized anxiety  disorder -Start sertraline 25 mg for 2 days, then increase to 50 mg starting Sunday (10/27/2022) for anxiety   As needed medications: -Patient to continue taking Tylenol 650 mg every 6 hours as needed for mild pain -Patient to continue taking Maalox/Mylanta 30 mL every 4 hours as needed for indigestion -Patient to continue taking Milk of Magnesia 30 mL as needed for mild constipation -Continue Hydroxyzine 25 mg 3 times daily as needed for anxiety -Continue Trazodone 50 mg at bedtime as needed for insomnia    Discharge Planning: Social work and case management to assist with discharge planning and identification of hospital follow-up needs prior to discharge Estimated LOS: 5-7  days Discharge Concerns: Need to establish a safety plan; Medication compliance and effectiveness Discharge Goals: Return home with outpatient referrals for mental health follow-up including medication management/psychotherapy  I certify that inpatient services furnished can reasonably be expected to improve the patient's condition.   Meta Hatchet, PA 10/25/2022, 12:56 PM  I discussed the case with the APP, and I agree with the assessment and plan of care as documented in the APP's note , as addended by me or notated below: Will start zoloft 25mg  and need inpatient stabilization  , MD Psychiatrist

## 2022-10-25 NOTE — Progress Notes (Signed)
Adult Psychoeducational Group Note  Date:  10/25/2022 Time:  11:42 AM  Group Topic/Focus:  Orientation:   The focus of this group is to educate the patient on the purpose and policies of crisis stabilization and provide a format to answer questions about their admission.  The group details unit policies and expectations of patients while admitted.  Participation Level:  Active  Participation Quality:  Appropriate  Affect:  Appropriate  Cognitive:  Appropriate  Insight: Appropriate  Engagement in Group:  Engaged  Modes of Intervention:  Discussion  Additional Comments:  Pt attended the orientation group and remained appropriate and engaged throughout the duration of the group.   Sheran Lawless 10/25/2022, 11:42 AM

## 2022-10-25 NOTE — Group Note (Signed)
Recreation Therapy Group Note   Group Topic:Problem Solving  Group Date: 10/25/2022 Start Time: 0930 End Time: 1005 Facilitators: Enas Winchel-McCall, LRT,CTRS Location: [address] Day[address]   Goal Area(s) Addresses:  Patient will effectively work with peer towards shared goal.  Patient will identify skills used to make activity successful.  Patient will identify how skills used during activity can be used to reach post d/c goals.    Group Description: Straw Bridge. In teams of 3-5, patients were given 15 plastic drinking straws and an equal length of masking tape. Using the materials provided, patients were instructed to build a free standing bridge-like structure to suspend an everyday item (ex: puzzle box) off of the floor or table surface. All materials were required to be used by the team in their design. LRT facilitated post-activity discussion reviewing team process. Patients were encouraged to reflect how the skills used in this activity can be generalized to daily life post discharge.   Affect/Mood: N/A   Participation Level: Did not attend    Clinical Observations/Individualized Feedback:      Plan: Continue to engage patient in RT group sessions 2-3x/week.   Willamina Grieshop-McCall, LRT,CTRS 10/25/2022 11:40 AM

## 2022-10-25 NOTE — Progress Notes (Signed)
Provider was able to reach out to patient's mother [name], [phone]): Provider discussed with patient's mother concerns that she had about her daughter's wellbeing while admitted to [facility].  Provider was able to provide clarity to patient's mother regarding patient's hospital course while admitted.  Provider informed patient's mother that she could bring drawling paper, crossword puzzles, and scrap paper to the patient during visitation.  Patient's mother states that she plans to visit the patient today at 6:30 PM.

## 2022-10-25 NOTE — H&P (Addendum)
Psychiatric Admission Assessment Adult  Patient Identification: Tara Mckee MRN:  [id number] Date of Evaluation:  10/25/2022 Chief Complaint:  MDD (major depressive disorder), recurrent severe, without psychosis ([place]) [F33.2] Principal Diagnosis: MDD (major depressive disorder), recurrent severe, without psychosis ([place]) Diagnosis:  Principal Problem:   MDD (major depressive disorder), recurrent severe, without psychosis ([place]) Active Problems:   Generalized anxiety disorder  CC: "I tried to overdose with sleeping pills."  History of Present illness:  Tara Mckee is a 21 year old female with no significant past psychiatric history who was involuntarily admitted to [facility] from [place] ED due to suicide attempt via drug overdose by ingesting 3 pills of tizanidine. During her assessment at [facility] ED, patient reported that she took 12 mg total of tizanidine at 5:00 pm (on 10/23/2022). Patient reported overdosing hoping that it would make things better. Patient reported a previous attempt years ago. She denied other co-ingestions and denied any substance abuse. Patient denied pregnancy.  A psych consult was prepared for patient prior to admission to [facility]. During her psych consult, patient informed the provider that she immediately reached out to her mother following the ingestion of the tizanidine. Once patient spoke with her mother, patient's mother involved her brother who subsequently called EMS. Patient reported that the trigger to her suicide attempt was her financial stressors. Patient denied prior history of mental illness, familial psychiatric background, or previous encounters with mental health professionals or medications. She denied a previous history of suicide attempts or self-harm practices. She further denied illicit substance use though she did mention a past history of smoking marijuana she has since quit. Patient reported living with her mother,  brothers, and uncle and is currently employed at the [employer]. 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 Patient reported sleep disturbances due to her second-shift work schedule. She denied auditory or visual hallucinations in addition to denying suicidal or homicidal ideations.  Mode of transport to Hospital: Patient transferred from [place] ED to [facility] Current Outpatient (Home) medication list: No current home medications PRN medication prior to evaluation: No prn medications prior to admission  ED Course: While patient was assessed in the ED, the following labs were run on the patient: CMP, CBC with differential, ethanol, acetaminophen level, salicylate level, urine rapid drug screen, urine pregnancy, urinalysis, and respiratory panel.  A complete metabolic panel was significant for elevated blood glucose (138 mg/dL), elevated serum creatinine (1.07 mg/dL), and decreased calcium (8.6 mg/dL).  Complete blood count with differential within normal limits.  Ethanol level within normal limits.  Normal acetaminophen level (< 10 ug/mL).  Normal salicylate level (< 7.0 mg/dL).  Urine drug screen negative.  Urine pregnancy negative.  Urinalysis significant for the presence of protein (30 mg/dL) and the presence of bacteria (rare).  Respiratory panel negative.  Collateral Information: Mother Gavin Pound, [phone]) POA/Legal Guardian: N/A  HPI:  Tara Mckee is a 21 year old female with no significant past psychiatric history who was involuntarily admitted to [facility] from [place] ED due to suicide attempt via drug overdose by ingesting 3 pills of tizanidine.  Patient reports that her suicide attempt was triggered by financial stressors.  She reports that she attempted 3 days ago.  Patient reports that she took 3 pills of tizanidine which she got access to from her home.  After attempting,  patient reports that she immediately called her mother, who in turn, contacted her  brother who then called EMS.  Prior to her suicide attempt, patient reports that she has been having suicidal thoughts off and on but never considered attempting until 3 days ago.  Patient reports that she is unsure of how long she has been having suicidal thoughts.  In addition to her suicidal thoughts, patient endorses depression that she has been dealing with for years.  Patient her depression to financial stress.  Patient endorses the following depressive symptoms: insomnia, low mood, hopelessness, weight loss, decreased appetite, psychomotor retardation, and suicidal thoughts.  Patient depressive symptoms are worsened by lack of money.  She states that her depression is alleviated by friends and family.  Prior to her admission to this facility, patient reports that she was receiving on average 5 hours of sleep a night.  She reports that her sleep was characterized by waking up periodically.  Patient reports that she has no problems with falling asleep but states that she often finds herself waking up at 2 in the morning.  Patient reports that she utilizes Benadryl and a sleep aid to help her sleep.  Patient endorses anxiety and rates her anxiety at 10 out of 10.  Patient reports that her anxiety is characterized by the following: agoraphobia, excessive worrying, social anxiety.  Patient endorses panic attacks and states that her last panic attack was last night.  She reports that her panic attack was triggered by being admitted to this facility.  Patient's panic attacks are characterized by the following symptoms: difficulty breathing, increased heart rate, lightheadedness, and feeling like there is an elephant on her chest.  Patient denies past history of manic episode.  Patient denies history of psychosis prior to her admission.  Patient denies a past history of hospitalization due to mental health.  Patient denies prior  history of suicide attempt and denies history of psychiatric medication use.  Patient reports that she is sad and her biggest issue is wanting to go home.  Patient rates her current depression an 8 out of 10 with 10 being most severe.  She reports that her depression has worsened due to being admitted.  Patient also endorses anxiety and rates her anxiety a 10 out of 10.  Patient denies suicidal or homicidal ideations.  She further denies auditory or visual hallucinations and does not appear to be responding to internal/external stimuli.  Patient denies paranoia or delusional thoughts.  Patient reports that her sleep was not good last night, even with the use of trazodone.  She reports her appetite was good yesterday but states that she has not eaten anything today.  Patient is casually dressed, sitting in bed, and alert and oriented x 4.  Patient is tearful during the assessment but is cooperative and able to answer all questions addressed to her.  Patient maintained good eye contact.  Patient's speech was clear, coherent, and with normal rate.  Patient's thought process is organized.  Patient's thought content is coherent.  Patient endorses depression and anxiety with congruent affect.  Patient denies suicidal ideations.  Patient does not appear to be responding to internal/external stimuli.  Past Psychiatric Hx Previous Psychiatric diagnoses: Patient denies past history of psychiatric diagnosis Prior inpatient treatment: Patient denies Current/prior outpatient treatment: Patient denies Prior rehab history: Patient denies Psychotherapy: Patient denies History of Suicide: Patient denies prior history of suicide attempt History of Homicide: Patient denies Psychiatric medication history: Patient denies Psychiatric medication compliance history: Not applicable Neuromodulation history: Not applicable Current Psychiatrist: Patient denies Current Therapist: Patient denies  Substance abuse  History Alcohol: Patient reports that she used to drink alcohol 2 years ago but denies current alcohol consumption Tobacco: Patient reports that she was using nicotine in the form of vaping, but currently denies Illicit drug use: Patient reports that she last smoked marijuana 2 years ago.  Patient denies current cannabis use. Rx drug abuse: Besides the utilization of tizanidine during her suicide attempt, patient denies prescription drug abuse Rehab history: Patient denies  Past Medical History Medical Diagnoses: Patient denies Home Prescriptions: Patient denies Prior Hospitalizations: Patient denies prior hospitalizations Prior Surgeries/Trauma: Patient denies prior surgeries/trauma Head trauma, loss of consciousness, concussions, seizures: Patient denies a past history of head trauma, loss of consciousness, concussions, or seizures. Allergies: Patient denies allergies Last menstrual period: Patient is unsure of when her last period was but states that she has been receiving the Depo shot.  She reports that she missed her last Depo shot. Contraception: Patient is currently on the Depo shot but states that she missed her most recent dose PCP: Patient reports that she has a primary care provider but does not remember their name  Family history Medical: Patient reports that her mother has a history of seizures and high blood pressure.  She reports that her mother takes medications for these illnesses.  Patient denies any other medical illnesses that run in the family. Psychiatric: Patient denies a past history of psychiatric illness within the family. Psychiatric prescriptions: Patient denies any family members that utilize psychiatric prescriptions Suicide Attempt: Patient denies a family history of suicide attempts Homicide Attempt: Patient denies a family history of homicide attempts Family history of substance use: Patient denies a family history of substance abuse  Social History Abuse:  Patient denies Marital Status: Patient is single Sexual Orientation: Not noted Children: Patient denies having children Employment: Patient is currently employed at an [employer] center.  Patient reports that she has only been at the job for 2 weeks Peer Group: Not noted Housing: Patient endorses housing and is living with her mother Finances: Patient endorses financial instability Legal: Patient denies Military: Patient denies  Associated Signs/Symptoms: Depression Symptoms:  depressed mood, insomnia, psychomotor retardation, hopelessness, suicidal attempt, anxiety, disturbed sleep, weight loss, decreased appetite, Duration of Depression Symptoms: Greater than two weeks  (Hypo) Manic Symptoms:   Patient denies past history of manic episodes Anxiety Symptoms:  Agoraphobia, Excessive Worry, Panic Symptoms, Social Anxiety, Psychotic Symptoms:   Patient denies PTSD Symptoms: Had a traumatic exposure:  Patient reports that the death of her grandmother was impactful Had a traumatic exposure in the last month:  N/A Re-experiencing:  Flashbacks Hypervigilance:  No Hyperarousal:  Emotional Numbness/Detachment Increased Startle Response Avoidance:  None Total Time spent with patient: 1 hour  Past Psychiatric History:  Patient denies a past history of psychiatric diagnosis  Is the patient at risk to self? Yes.    Has the patient been a risk to self in the past 6 months? No.  Has the patient been a risk to self within the distant past? No.  Is the patient a risk to others? No.  Has the patient been a risk to others in the past 6 months? No.  Has the patient been a risk to others within the distant past? No.   [place] Scale:  [place] Admission (Current) from 10/24/2022 in [place] [facility] ED from 10/23/2022 in [place] DEPT ED from 09/14/2022 in [place] No Risk  High Risk No Risk        Prior Inpatient Therapy:  Patient denies a past history of hospitalization due to mental health Prior Outpatient Therapy:  Patient denies  Alcohol Screening: 1. How often do you have a drink containing alcohol?: Monthly or less 2. How many drinks containing alcohol do you have on a typical day when you are drinking?: 3 or 4 3. How often do you have six or more drinks on one occasion?: Less than monthly AUDIT-C Score: 3 4. How often during the last year have you found that you were not able to stop drinking once you had started?: Never 5. How often during the last year have you failed to do what was normally expected from you because of drinking?: Never 6. How often during the last year have you needed a first drink in the morning to get yourself going after a heavy drinking session?: Never 7. How often during the last year have you had a feeling of guilt of remorse after drinking?: Never 8. How often during the last year have you been unable to remember what happened the night before because you had been drinking?: Never 9. Have you or someone else been injured as a result of your drinking?: No 10. Has a relative or friend or a doctor or another health worker been concerned about your drinking or suggested you cut down?: No Alcohol Use Disorder Identification Test Final Score (AUDIT): 3 Substance Abuse History in the last 12 months:  No. Consequences of Substance Abuse: Negative Previous Psychotropic Medications: No  Psychological Evaluations: No  Past Medical History:  Past Medical History:  Diagnosis Date   Asthma    Eczema     Past Surgical History:  Procedure Laterality Date   ear tag     HERNIA REPAIR     Family History:  Family History  Problem Relation Age of Onset   Kidney Stones Mother    Seizures Mother    Migraines Mother    ADD / ADHD Brother    Family Psychiatric  History: Patient denies a family history of psychiatric  illness Tobacco Screening:   Social History:  Social History   Substance and Sexual Activity  Alcohol Use No   Comment: occas     Social History   Substance and Sexual Activity  Drug Use No    Additional Social History:   Allergies:   Allergies  Allergen Reactions   Molds & Smuts Shortness Of Breath   Lab Results:  Results for orders placed or performed during the hospital encounter of 10/23/22 (from the past 48 hour(s))  Comprehensive metabolic panel     Status: Abnormal   Collection Time: 10/23/22  8:13 PM  Result Value Ref Range   Sodium 138 135 - 145 mmol/L   Potassium 3.7 3.5 - 5.1 mmol/L   Chloride 109 98 - 111 mmol/L   CO2 24 22 - 32 mmol/L   Glucose, Bld 138 (H) 70 - 99 mg/dL    Comment: Glucose reference range applies only to samples taken after fasting for at least 8 hours.   BUN 14 6 - 20 mg/dL   Creatinine, Ser 1.61 (H) 0.44 - 1.00 mg/dL   Calcium 8.6 (L) 8.9 - 10.3 mg/dL   Total Protein 6.8 6.5 - 8.1 g/dL   Albumin 3.7 3.5 - 5.0 g/dL   AST 16 15 - 41 U/L   ALT 12 0 - 44 U/L   Alkaline Phosphatase 48 38 - 126  U/L   Total Bilirubin 0.5 0.3 - 1.2 mg/dL   GFR, Estimated >60 >60 mL/min    Comment: (NOTE) Calculated using the CKD-EPI Creatinine Equation (2021)    Anion gap 5 5 - 15    Comment: Performed at [facility], [place] [address], [place], [place] [address]  CBC with Differential     Status: None   Collection Time: 10/23/22  8:13 PM  Result Value Ref Range   WBC 4.7 4.0 - 10.5 K/uL   RBC 4.20 3.87 - 5.11 MIL/uL   Hemoglobin 12.0 12.0 - 15.0 g/dL   HCT 37.1 36.0 - 46.0 %   MCV 88.3 80.0 - 100.0 fL   MCH 28.6 26.0 - 34.0 pg   MCHC 32.3 30.0 - 36.0 g/dL   RDW 13.3 11.5 - 15.5 %   Platelets 242 150 - 400 K/uL   nRBC 0.0 0.0 - 0.2 %   Neutrophils Relative % 62 %   Neutro Abs 2.9 1.7 - 7.7 K/uL   Lymphocytes Relative 30 %   Lymphs Abs 1.4 0.7 - 4.0 K/uL   Monocytes Relative 6 %   Monocytes Absolute 0.3 0.1 - 1.0 K/uL    Eosinophils Relative 2 %   Eosinophils Absolute 0.1 0.0 - 0.5 K/uL   Basophils Relative 0 %   Basophils Absolute 0.0 0.0 - 0.1 K/uL   Immature Granulocytes 0 %   Abs Immature Granulocytes 0.01 0.00 - 0.07 K/uL    Comment: Performed at [facility], [place] [address], [place], [place] [address]  Ethanol     Status: None   Collection Time: 10/23/22  8:13 PM  Result Value Ref Range   Alcohol, Ethyl (B) <10 <10 mg/dL    Comment: (NOTE) Lowest detectable limit for serum alcohol is 10 mg/dL.  For medical purposes only. Performed at [facility], [place] [address] [place], [place] [address]   Acetaminophen level     Status: Abnormal   Collection Time: 10/23/22  8:13 PM  Result Value Ref Range   Acetaminophen (Tylenol), Serum <10 (L) 10 - 30 ug/mL    Comment: (NOTE) Therapeutic concentrations vary significantly. A range of 10-30 ug/mL  may be an effective concentration for many patients. However, some  are best treated at concentrations outside of this range. Acetaminophen concentrations >150 ug/mL at 4 hours after ingestion  and >50 ug/mL at 12 hours after ingestion are often associated with  toxic reactions.  Performed at [facility], [place] [address] [place], [place] [id number]   Salicylate level     Status: Abnormal   Collection Time: 10/23/22  8:13 PM  Result Value Ref Range   Salicylate Lvl [id number] (L) 7.0 - 30.0 mg/dL    Comment: Performed at [facility], [place] [address], [place], [place] [address]  Urine rapid drug screen (hosp performed)     Status: None   Collection Time: 10/24/22  1:32 AM  Result Value Ref Range   Opiates NONE DETECTED NONE DETECTED   Cocaine NONE DETECTED NONE DETECTED   Benzodiazepines NONE DETECTED NONE DETECTED   Amphetamines NONE DETECTED NONE DETECTED   Tetrahydrocannabinol NONE DETECTED NONE DETECTED   Barbiturates NONE DETECTED NONE DETECTED    Comment: (NOTE) DRUG SCREEN FOR  MEDICAL PURPOSES ONLY.  IF CONFIRMATION IS NEEDED FOR ANY PURPOSE, NOTIFY LAB WITHIN 5 DAYS.  LOWEST DETECTABLE LIMITS FOR URINE DRUG SCREEN Drug Class  Cutoff (ng/mL) Amphetamine and metabolites    1000 Barbiturate and metabolites    200 Benzodiazepine                 200 Opiates and metabolites        300 Cocaine and metabolites        300 THC                            50 Performed at [facility], [place] [address], [place], [place] [address]   Pregnancy, urine     Status: None   Collection Time: 10/24/22  1:33 AM  Result Value Ref Range   Preg Test, Ur NEGATIVE NEGATIVE    Comment:        THE SENSITIVITY OF THIS METHODOLOGY IS >20 mIU/mL. Performed at [facility], [place] [address] [place], [place] [address]   Urinalysis, Routine w reflex microscopic     Status: Abnormal   Collection Time: 10/24/22  1:33 AM  Result Value Ref Range   Color, Urine YELLOW YELLOW   APPearance CLEAR CLEAR   Specific Gravity, Urine 1.027 1.005 - 1.030   pH 6.0 5.0 - 8.0   Glucose, UA NEGATIVE NEGATIVE mg/dL   Hgb urine dipstick NEGATIVE NEGATIVE   Bilirubin Urine NEGATIVE NEGATIVE   Ketones, ur NEGATIVE NEGATIVE mg/dL   Protein, ur 30 (A) NEGATIVE mg/dL   Nitrite NEGATIVE NEGATIVE   Leukocytes,Ua NEGATIVE NEGATIVE   RBC / HPF 0-5 0 - 5 RBC/hpf   WBC, UA 0-5 0 - 5 WBC/hpf   Bacteria, UA RARE (A) NONE SEEN   Squamous Epithelial / LPF 0-5 0 - 5   Mucus PRESENT     Comment: Performed at [facility], [place] [address], [place], [place] [address]  Resp Panel by RT-PCR (Flu A&B, Covid) Anterior Nasal Swab     Status: None   Collection Time: 10/24/22  2:51 AM   Specimen: Anterior Nasal Swab  Result Value Ref Range   SARS Coronavirus 2 by RT PCR NEGATIVE NEGATIVE    Comment: (NOTE) SARS-CoV-2 target nucleic acids are NOT DETECTED.  The SARS-CoV-2 RNA is generally detectable in upper respiratory specimens during the  acute phase of infection. The lowest concentration of SARS-CoV-2 viral copies this assay can detect is 138 copies/mL. A negative result does not preclude SARS-Cov-2 infection and should not be used as the sole basis for treatment or other patient management decisions. A negative result may occur with  improper specimen collection/handling, submission of specimen other than nasopharyngeal swab, presence of viral mutation(s) within the areas targeted by this assay, and inadequate number of viral copies(<138 copies/mL). A negative result must be combined with clinical observations, patient history, and epidemiological information. The expected result is Negative.  Fact Sheet for Patients:  EntrepreneurPulse.com.au  Fact Sheet for Healthcare Providers:  IncredibleEmployment.be  This test is no t yet approved or cleared by the [place] FDA and  has been authorized for detection and/or diagnosis of SARS-CoV-2 by FDA under an Emergency Use Authorization (EUA). This EUA will remain  in effect (meaning this test can be used) for the duration of the COVID-19 declaration under Section 564(b)(1) of the Act, 21 U.S.C.section 360bbb-3(b)(1), unless the authorization is terminated  or revoked sooner.       Influenza A by PCR NEGATIVE NEGATIVE   Influenza B by PCR NEGATIVE NEGATIVE    Comment: (NOTE) The Xpert Xpress  SARS-CoV-2/FLU/RSV plus assay is intended as an aid in the diagnosis of influenza from Nasopharyngeal swab specimens and should not be used as a sole basis for treatment. Nasal washings and aspirates are unacceptable for Xpert Xpress SARS-CoV-2/FLU/RSV testing.  Fact Sheet for Patients: EntrepreneurPulse.com.au  Fact Sheet for Healthcare Providers: IncredibleEmployment.be  This test is not yet approved or cleared by the [place] FDA and has been authorized for detection and/or diagnosis of  SARS-CoV-2 by FDA under an Emergency Use Authorization (EUA). This EUA will remain in effect (meaning this test can be used) for the duration of the COVID-19 declaration under Section 564(b)(1) of the Act, 21 U.S.C. section 360bbb-3(b)(1), unless the authorization is terminated or revoked.  Performed at [facility], [place] [address], [place], [place] [address]     Blood Alcohol level:  Lab Results  Component Value Date   ETH <10 123XX123    Metabolic Disorder Labs:  Lab Results  Component Value Date   HGBA1C 5.1 04/20/2020   MPG 100 04/20/2020   No results found for: "PROLACTIN" Lab Results  Component Value Date   CHOL 132 01/06/2017   TRIG 47 01/06/2017   HDL 67 01/06/2017   CHOLHDL 2.0 01/06/2017   VLDL 9 01/06/2017   LDLCALC 56 01/06/2017    Current Medications: Current Facility-Administered Medications  Medication Dose Route Frequency Provider Last Rate Last Admin   acetaminophen (TYLENOL) tablet 650 mg  650 mg Oral Q6H PRN Ntuen, Kris Hartmann, FNP       alum & mag hydroxide-simeth (MAALOX/MYLANTA) 200-200-20 MG/5ML suspension 30 mL  30 mL Oral Q4H PRN Ntuen, Kris Hartmann, FNP       hydrOXYzine (ATARAX) tablet 25 mg  25 mg Oral TID PRN Laretta Bolster, FNP   25 mg at 10/24/22 1954   magnesium hydroxide (MILK OF MAGNESIA) suspension 30 mL  30 mL Oral Daily PRN Ntuen, Kris Hartmann, FNP       sertraline (ZOLOFT) tablet 25 mg  25 mg Oral Daily Ngina Royer E, PA       And   [START ON 10/27/2022] sertraline (ZOLOFT) tablet 50 mg  50 mg Oral Daily Ole Lafon E, PA       traZODone (DESYREL) tablet 50 mg  50 mg Oral QHS PRN Ntuen, Kris Hartmann, FNP   50 mg at 10/24/22 2102   PTA Medications: Medications Prior to Admission  Medication Sig Dispense Refill Last Dose   norelgestromin-ethinyl estradiol [name] 150-35 MCG/24HR transdermal patch Place 1 patch onto the skin once a week. Apply one(1) patch on first week. Remove this patch after one(1) week and place a new patch.  Continue for three(3) weeks. On the fourth (4th) week, remove patch and do not place a new one. After completing one(1) week without a patch, restart placing one(1) new patch per week, for three(3) weeks, and continue the above cycle. 6 patch 6     Musculoskeletal: Strength & Muscle Tone: within normal limits Gait & Station: normal Patient leans: N/A            Psychiatric Specialty Exam:  Presentation  General Appearance:  Appropriate for Environment; Casual  Eye Contact: Fair  Speech: Clear and Coherent; Normal Rate  Speech Volume: Normal  Handedness: Right   Mood and Affect  Mood: Anxious; Depressed  Affect: Congruent; Tearful   Thought Process  Thought Processes: Coherent; Goal Directed  Duration of Psychotic Symptoms: No data recorded Past Diagnosis of Schizophrenia or Psychoactive disorder: No  Descriptions of  Associations:Intact  Orientation:Full (Time, Place and Person)  Thought Content:Logical  Hallucinations:Hallucinations: None  Ideas of Reference:None  Suicidal Thoughts:Suicidal Thoughts: No  Homicidal Thoughts:Homicidal Thoughts: No   Sensorium  Memory: Immediate Good; Recent Good; Remote Good  Judgment: Fair  Insight: Fair   Community education officer  Concentration: Good  Attention Span: Good  Recall: Good  Fund of Knowledge: Good  Language: Good   Psychomotor Activity  Psychomotor Activity: Psychomotor Activity: Normal   Assets  Assets: Communication Skills; Desire for Improvement; Financial Resources/Insurance; Housing; Physical Health; Resilience; Social Support; Vocational/Educational   Sleep  Sleep: Sleep: Fair    Physical Exam: Physical Exam Constitutional:      Appearance: Normal appearance.  HENT:     Head: Normocephalic and atraumatic.     Nose: Nose normal.     Mouth/Throat:     Mouth: Mucous membranes are moist.  Eyes:     Extraocular Movements: Extraocular movements intact.      Pupils: Pupils are equal, round, and reactive to light.  Cardiovascular:     Rate and Rhythm: Normal rate and regular rhythm.  Pulmonary:     Effort: Pulmonary effort is normal.     Breath sounds: Normal breath sounds.  Abdominal:     General: Abdomen is flat.  Musculoskeletal:        General: Normal range of motion.     Cervical back: Normal range of motion and neck supple.  Skin:    General: Skin is warm and dry.  Neurological:     General: No focal deficit present.     Mental Status: She is alert and oriented to person, place, and time.  Psychiatric:        Attention and Perception: Attention and perception normal. She does not perceive auditory or visual hallucinations.        Mood and Affect: Mood is anxious and depressed. Affect is tearful.        Speech: Speech normal.        Behavior: Behavior is agitated. Behavior is cooperative.        Thought Content: Thought content normal. Thought content is not paranoid or delusional. Thought content does not include homicidal or suicidal ideation.        Cognition and Memory: Cognition and memory normal.        Judgment: Judgment normal.    Review of Systems  Constitutional: Negative.   HENT: Negative.    Eyes: Negative.   Respiratory: Negative.    Cardiovascular: Negative.   Gastrointestinal: Negative.   Skin: Negative.   Neurological: Negative.   Psychiatric/Behavioral:  Positive for depression. Negative for hallucinations, substance abuse and suicidal ideas. The patient is nervous/anxious. The patient does not have insomnia.    Blood pressure 133/87, pulse 78, temperature 98.6 F (37 C), temperature source Oral, resp. rate 18, height 5\' 6"  (1.676 m), weight 56.2 kg, last menstrual period 10/02/2022, SpO2 100 %. Body mass index is 20.01 kg/m.  Treatment Plan Summary: Daily contact with patient to assess and evaluate symptoms and progress in treatment and Medication management  Observation Level/Precautions:  15 minute  checks  Laboratory: Labs were independently reviewed on 10/25/2022  Psychotherapy: Unit group session  Medications:  See [facility]  Consultations:  To be determined  Discharge Concerns: Safety, medication compliance, mood stability  Estimated LOS: 5 - 7 days  Other:  N/A   Physician Treatment Plan for Primary Diagnosis: MDD (major depressive disorder), recurrent severe, without psychosis ([place]) Long Term Goal(s): Improvement in symptoms so  as ready for discharge  Short Term Goals: Ability to identify changes in lifestyle to reduce recurrence of condition will improve, Ability to verbalize feelings will improve, Ability to disclose and discuss suicidal ideas, Ability to demonstrate self-control will improve, Ability to identify and develop effective coping behaviors will improve, Ability to maintain clinical measurements within normal limits will improve, Compliance with prescribed medications will improve, and Ability to identify triggers associated with substance abuse/mental health issues will improve  Physician Treatment Plan for Secondary Diagnosis: Principal Problem:   MDD (major depressive disorder), recurrent severe, without psychosis ([place]) Active Problems:   Generalized anxiety disorder  Long Term Goal(s): Improvement in symptoms so as ready for discharge  Short Term Goals: Ability to identify changes in lifestyle to reduce recurrence of condition will improve, Ability to verbalize feelings will improve, Ability to disclose and discuss suicidal ideas, Ability to demonstrate self-control will improve, Ability to identify and develop effective coping behaviors will improve, Ability to maintain clinical measurements within normal limits will improve, Compliance with prescribed medications will improve, and Ability to identify triggers associated with substance abuse/mental health issues will improve  Plan:  Patient is admitted to [facility] due to attempted suicide via drug overdose by the  ingestion of 3 tizanidine pills.  Patient denied a prior history of suicide attempts.  Prior to the assessment, patient was extremely tearful and vocalized not wanting to be admitted and wanting to go home.  Provider explained to patient that she is involuntarily admitted to this facility and would not be able to be discharged today. After explaining her situation, patient appeared understanding.  Patient endorsed depression and anxiety but denied suicidal ideations.  Patient further denied psychosis at this time and did not appear to be responding to internal/external stimuli.  Patient was agreeable to start medication for the management of her major depressive disorder and generalized anxiety disorder.  Patient to be started on sertraline 25 mg for the next 2 days, followed by 50 mg daily.  Safety and Monitoring: Voluntary admission to inpatient psychiatric unit for safety, stabilization and treatment Daily contact with patient to assess and evaluate symptoms and progress in treatment Patient's case to be discussed in multi-disciplinary team meeting Observation Level : q15 minute checks Vital signs: q12 hours Precautions: safety  Diagnosis:  Principal Problem:   MDD (major depressive disorder), recurrent severe, without psychosis ([place]) Active Problems:   Generalized anxiety disorder  #Major depressive disorder, recurrent severe, without psychosis -Start sertraline 25 mg for 2 days, then increase to 50 mg starting Sunday (10/27/2022) for depression  #Generalized anxiety disorder -Start sertraline 25 mg for 2 days, then increase to 50 mg starting Sunday (10/27/2022) for anxiety  As needed medications: -Patient to continue taking Tylenol 650 mg every 6 hours as needed for mild pain -Patient to continue taking Maalox/Mylanta 30 mL every 4 hours as needed for indigestion -Patient to continue taking Milk of Magnesia 30 mL as needed for mild constipation -Continue Hydroxyzine 25 mg 3 times daily  as needed for anxiety -Continue Trazodone 50 mg at bedtime as needed for insomnia   Discharge Planning: Social work and case management to assist with discharge planning and identification of hospital follow-up needs prior to discharge Estimated LOS: 5-7 days Discharge Concerns: Need to establish a safety plan; Medication compliance and effectiveness Discharge Goals: Return home with outpatient referrals for mental health follow-up including medication management/psychotherapy   I certify that inpatient services furnished can reasonably be expected to improve the patient's condition.  Malachy Mood, PA 11/24/202311:30 AM

## 2022-10-25 NOTE — [facility] Counselor (Signed)
Adult Comprehensive Assessment  Patient ID: Tara Mckee, female   DOB: [birth year], 21 y.o.   MRN: [id number]  Information Source: Information source: Patient  Current Stressors:  Patient states their primary concerns and needs for treatment are:: Patient states that her primary concerns and needs for treatment are to learn how to cope with stress Patient states their goals for this hospitilization and ongoing recovery are:: Patient states that her goals for this hopitilization and ongoing recovery is to learn all she can about copping skills and states, " I have learned my lesson and will not try to take sleeping pills" Educational / Learning stressors: Patient stated that she had to take a semester off just to work and pay bills. Also states that her school stresses her at time such as the people and work Employment / Job issues: None Family Relationships: Patient states that her mom is Science writer, my mom is currently not working  due to her back and has me taking care of everything financialEngineer, petroleum / Lack of resources (include bankruptcy): None Housing / Lack of housing: None Physical health (include injuries & life threatening diseases): None Social relationships: Patient does not like confortation and at times people at her school are confortation. Patient also stated that her friends sometimes try to take advantage of her Substance abuse: None Bereavement / Loss: None  Living/Environment/Situation:  Living Arrangements: Other relatives, Parent Living conditions (as described by patient or guardian): Patient states, " it's a roof over our head" Who else lives in the home?: Uncle, mom, and twobrothers lives in the home How long has patient lived in current situation?: Since 2019 patient had been living in uncle house with mom and two brothers What is atmosphere in current home: Comfortable  Family History:  Marital status: Single Are you sexually active?: No What is your sexual  orientation?: straight Has your sexual activity been affected by drugs, alcohol, medication, or emotional stress?: No Does patient have children?: No  Childhood History:  By whom was/is the patient raised?: Mother, Grandparents Additional childhood history information: None Description of patient's relationship with caregiver when they were a child: Patient states that her realtionship with her caregiver when she was a child was "good" Patient's description of current relationship with people who raised him/her: Patient states that her current relationship with her mom now is "good" How were you disciplined when you got in trouble as a child/adolescent?: Patient states that she was not disciplined as much, but if she was , she would get whooped Does patient have siblings?: Yes Number of Siblings: 2 Description of patient's current relationship with siblings: Patient has a older and younger brother who both she is close with Did patient suffer any verbal/emotional/physical/sexual abuse as a child?: No Did patient suffer from severe childhood neglect?: No Has patient ever been sexually abused/assaulted/raped as an adolescent or adult?: No Was the patient ever a victim of a crime or a disaster?: No Witnessed domestic violence?: No Has patient been affected by domestic violence as an adult?: No  Education:  Highest grade of school patient has completed: High school and 2 years of college at [employer] Currently a student?: Yes Name of school: [employer] How long has the patient attended?: 2 years 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 What learning problems does patient have?: Patient states that she had "IEP" where she needed extra help in school with her work  Employment/Work Situation:   Employment Situation: Employed Where is Patient Currently Employed?: Patient currently works a [employer] has  Patient Been Employed?: patient had been at [employer] for 2 weeks and was at [employer] for 3  years Are You Satisfied With Your Job?: Yes Do You Work More Than One Job?: No Work Stressors: None Patient's Job has Been Impacted by Current Illness: No Describe how Patient's Job has Been Impacted: None What is the Longest Time Patient has Held a Job?: Patient stated that the longest she has held a job was for 3 years Where was the Patient Employed at that Time?: Patient stated that she was emplyed at [employer] Has Patient ever Been in the 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 How does patient's faith help to cope with current illness?: NA  Leisure/Recreation:   Do You Have Hobbies?: No  Strengths/Needs:   What is the patient's perception of their strengths?: Patient states that her strengths are " helping out when she is needed" Patient states they can use these personal strengths during their treatment to contribute to their recovery: DNA Patient states these barriers may affect/interfere with their treatment: DNA Patient states these barriers may affect their return to the community: DNA Other important information patient would like considered in planning for their treatment: Patient just wants to learn coping skills and states that she will not try to do anything like this ever again  Discharge Plan:   Currently receiving community mental health services: No Patient  states concerns and preferences for aftercare planning are: NA Patient states they will know when they are safe and ready for discharge when: NA Does patient have access to transportation?: Yes Does patient have financial barriers related to discharge medications?: No Patient description of barriers related to discharge medications: NA Will patient be returning to same living situation after discharge?: Yes  Summary/Recommendations:   Summary and Recommendations (to be completed by the evaluator): Tara Mckee is a 21 year old female who was admitted to the hospital due to attempt SI with sleeping pills. Tara Mckee does not have a remarkable psychiatric history but states that her current stressors are her mom , who lost her job and is having patient work to pay bills, school where she had to stop for a semester to work and care for the household, and social relationships. Tara Mckee stated that her school, the workload, and her friend can stress her out at time especially since is not confortational but others around her try to be or try to take advantage of her. Tara Mckee currently does not have any outside providers that she sees but states that she is open to seeing someone. Tara Mckee states that her goals are to learn how to cope with her stress and return back to college, Tara Mckee in January to finish school.While here, Tara Mckee can benefit from crisis stabilization, medication management, therapeutic milieu, and referrals for services.   Tara Mckee. 10/25/2022

## 2022-10-26 MED ORDER — HYDROXYZINE HCL 50 MG PO TABS
50.0000 mg | ORAL_TABLET | Freq: Three times a day (TID) | ORAL | Status: DC | PRN
  Administered 2022-10-26 – 2022-10-27 (×2): 50 mg via ORAL
  Filled 2022-10-26 (×2): qty 1

## 2022-10-26 NOTE — [facility] Suicide Risk Assessment (Signed)
[facility] INPATIENT:  Family/Significant Other Suicide Prevention Education  Suicide Prevention Education:  Education Completed; mother [name] [phone],  (name of family member/significant other) has been identified by the patient as the family member/significant other with whom the patient will be residing, and identified as the person(s) who will aid the patient in the event of a mental health crisis (suicidal ideations/suicide attempt).    Mother is concerned that perhaps the patient internalizes mother's issues, tries to mother her.  There are no guns in the home.  The patient will be returning home to live with mother and uncle.  They have all been through a lot, which mother describes.  She states they are in transitional housing and even though they know uncle will not put them out, living in somebody else's house  is not the same as having your own place.  They are not allowed to have people over, for instance.  Mother had an issue at work and no longer has that job, so the support she could give the patient for school went away.  Now they are trying to get the patient back in school.  Patient is stressed about not having a car "by January," even though mother keeps telling her most students don't have a car.    Mother states the patient has problems with sleep and has had this for years.  She asks if the hospital is able to help with that, to please do so.  With written consent from the patient, the family member/significant other has been provided the following suicide prevention education, prior to the and/or following the discharge of the patient.  The suicide prevention education provided includes the following: Suicide risk factors Suicide prevention and interventions National Suicide Hotline telephone number [facility] assessment telephone number [facility] Emergency Assistance 911 [facility] and/or Residential Mobile Crisis Unit telephone number  Request  made of family/significant other to: Remove weapons (e.g., guns, rifles, knives), all items previously/currently identified as safety concern.   Remove drugs/medications (over-the-counter, prescriptions, illicit drugs), all items previously/currently identified as a safety concern.  The family member/significant other verbalizes understanding of the suicide prevention education information provided.  The family member/significant other agrees to remove the items of safety concern listed above.  Carloyn Jaeger Grossman-Orr 10/26/2022, 5:40 PM

## 2022-10-26 NOTE — Progress Notes (Signed)
[facility]  10/26/2022 3:42 PM Tara Mckee  MRN:  [id number]  Subjective: Tara SprungAmari Mckee states," I actually feel good today because I can communicate with people that do listen to me during group activities and therapeutic milieu."  Reason for admission:Tara Mckee is a 21 year old female with no significant past psychiatric history who was [facility]untarily [facility] due to suicide attempt via drug overdose by ingesting 3 pills of tizanidine.   Patient reports that her suicide attempt was triggered by financial stressors.  She reports that she attempted 3 days ago.  Patient reports that she took 3 pills of tizanidine which she got access to from her home.  After attempting, patient reports that she immediately called her mother, who in turn, contacted her brother who then called EMS.  Prior to her suicide attempt, patient reports that she has been having suicidal thoughts off and on but never considered attempting until 3 days ago.  Patient reports that she is unsure of how long she has been having suicidal thoughts.  Yesterday's psychiatric team recommendations: Start sertraline 25 mg p.o. daily for 2 days, then increase to 50 mg starting Sunday, 10/27/2022 for anxiety. Continue hydroxyzine 25 mg p.o. 3 times daily as needed for anxiety 10/26/2022 increase hydroxyzine to 50 mg p.o. 3 times daily as needed for anxiety/sleep. Continue trazodone 50 mg p.o. at bedtime as needed for insomnia  Today's Assessment: Patient is seen face-to-face and examined on [address].  She appears calm and participating fully in the exams.  Alert and oriented x 4, speech clear, coherent with normal pattern and volume.  Presents with anxious but pleasant mood and congruent affect.  Presents with goal directed and coherent thought process.  The plan is to resume school after the semester is over.  Thought content is logical.  Reports less depress mood.  Reports  anxiety of 6/10, with 10 being the worst.  Reports sleeping for only 4 hours last night.  Hydroxyzine increased to 50 mg p.o. 3 times daily as needed for anxiety and sleep.  Will monitor effectiveness.  Taking her prescribed medication without reports somatic symptoms.  Reports improved appetite and adequate energy.  Observed participating effectively on therapeutic milieu and group activities on the unit.  Labs and vital signs reviewed.  Patient denies suicidal ideation, HI, or AVH.  She further denies psychotic symptoms.  Does not appear to be responding to internal or external stimuli.  Able to contract for safety with this provider.  Report her stressors to be financial, school stress, and has taken 1 semester off from school to rest.  Reports working at [employer] [place] and plans for discharge early next week so that she can report to work.  Supportive therapy provided for ongoing stressors.   Principal Problem: MDD (major depressive disorder), recurrent severe, without psychosis (HCC)  Diagnosis: Principal Problem:   MDD (major depressive disorder), recurrent severe, without psychosis (HCC) Active Problems:   Generalized anxiety disorder  Total Time spent with patient: 30 minutes  Past Psychiatric History:  Previous Psychiatric diagnoses: Patient denies past history of psychiatric diagnosis Prior inpatient treatment: Patient denies Current/prior outpatient treatment: Patient denies Prior rehab history: Patient denies Psychotherapy: Patient denies History of Suicide: Patient denies prior history of suicide attempt History of Homicide: Patient denies Psychiatric medication history: Patient denies Psychiatric medication compliance history: Not applicable Neuromodulation history: Not applicable Current Psychiatrist: Patient denies Current Therapist: Patient denies Past Medical History:  Past Medical History:  Diagnosis Date   Asthma    Eczema     Past Surgical  History:  Procedure Laterality Date   ear tag     HERNIA REPAIR     Family History:  Family History  Problem Relation Age of Onset   Kidney Stones Mother    Seizures Mother    Migraines Mother    ADD / ADHD Brother    Family Psychiatric  History: Patient denies a past history of psychiatric illness within the family. Psychiatric prescriptions: Patient denies any family members that utilize psychiatric prescriptions Suicide Attempt: Patient denies a family history of suicide attempts Homicide Attempt: Patient denies a family history of homicide attempts Family history of substance use: Patient denies a family history of substance abuse   Social History:  Social History   Substance and Sexual Activity  Alcohol Use No   Comment: occas     Social History   Substance and Sexual Activity  Drug Use No    Social History   Socioeconomic History   Marital status: Single    Spouse name: Not on file   Number of children: Not on file   Years of education: Not on file   Highest education level: Not on file  Occupational History   Not on file  Tobacco Use   Smoking status: Never    Passive exposure: Yes   Smokeless tobacco: Never   Tobacco comments:    mom smokes  Vaping Use   Vaping Use: Every day  Substance and Sexual Activity   Alcohol use: No    Comment: occas   Drug use: No   Sexual activity: Yes    Birth control/protection: Patch  Other Topics Concern   Not on file  Social History Narrative   Tara Mckee lives with her brothers, mom and uncle.     Social Determinants of Health   Financial Resource Strain: Not on file  Food Insecurity: No Food Insecurity (10/24/2022)   Hunger Vital Sign    Worried About Running Out of Food in the Last Year: Never true    Ran Out of Food in the Last Year: Never true  Transportation Needs: No Transportation Needs (10/24/2022)   PRAPARE - Administrator, Civil Service (Medical): No    Lack of Transportation (Non-Medical):  No  Physical Activity: Not on file  Stress: Not on file  Social Connections: Not on file   Additional Social History:    Sleep: Fair  Appetite:  Good  Current Medications: Current Facility-Administered Medications  Medication Dose Route Frequency Provider Last Rate Last Admin   acetaminophen (TYLENOL) tablet 650 mg  650 mg Oral Q6H PRN Tyniah Kastens, Jesusita Oka, FNP       alum & mag hydroxide-simeth (MAALOX/MYLANTA) 200-200-20 MG/5ML suspension 30 mL  30 mL Oral Q4H PRN Gabbriella Presswood, Jesusita Oka, FNP       hydrOXYzine (ATARAX) tablet 50 mg  50 mg Oral TID PRN Faust Thorington, Jesusita Oka, FNP       magnesium hydroxide (MILK OF MAGNESIA) suspension 30 mL  30 mL Oral Daily PRN Seila Liston, Jesusita Oka, FNP       [START ON 10/27/2022] sertraline (ZOLOFT) tablet 50 mg  50 mg Oral Daily Nwoko, Uchenna E, PA       traZODone (DESYREL) tablet 50 mg  50 mg Oral QHS PRN Cecilie Lowers, FNP   50 mg at 10/25/22 2118    Lab Results:  Results for orders placed or performed during  the hospital encounter of 10/24/22 (from the past 48 hour(s))  CBC     Status: None   Collection Time: 10/25/22  6:09 PM  Result Value Ref Range   WBC 4.8 4.0 - 10.5 K/uL   RBC 4.48 3.87 - 5.11 MIL/uL   Hemoglobin 12.5 12.0 - 15.0 g/dL   HCT 62.1 30.8 - 65.7 %   MCV 88.6 80.0 - 100.0 fL   MCH 27.9 26.0 - 34.0 pg   MCHC 31.5 30.0 - 36.0 g/dL   RDW 84.6 96.2 - 95.2 %   Platelets 270 150 - 400 K/uL   nRBC 0.0 0.0 - 0.2 %    Comment: Performed at [facility], [address] [place], [place] [address]  Comprehensive metabolic panel     Status: Abnormal   Collection Time: 10/25/22  6:09 PM  Result Value Ref Range   Sodium 137 135 - 145 mmol/L   Potassium 4.5 3.5 - 5.1 mmol/L   Chloride 107 98 - 111 mmol/L   CO2 24 22 - 32 mmol/L   Glucose, Bld 97 70 - 99 mg/dL    Comment: Glucose reference range applies only to samples taken after fasting for at least 8 hours.   BUN 14 6 - 20 mg/dL   Creatinine, Ser 4.40 0.44 - 1.00 mg/dL   Calcium 8.8 (L)  8.9 - 10.3 mg/dL   Total Protein 7.0 6.5 - 8.1 g/dL   Albumin 3.9 3.5 - 5.0 g/dL   AST 14 (L) 15 - 41 U/L   ALT 11 0 - 44 U/L   Alkaline Phosphatase 51 38 - 126 U/L   Total Bilirubin 0.4 0.3 - 1.2 mg/dL   GFR, Estimated >10 >27 mL/min    Comment: (NOTE) Calculated using the CKD-EPI Creatinine Equation (2021)    Anion gap 6 5 - 15    Comment: Performed at [facility], [address], [place], [place] [address]  Lipid panel     Status: None   Collection Time: 10/25/22  6:09 PM  Result Value Ref Range   Cholesterol 143 0 - 200 mg/dL   Triglycerides 440 <347 mg/dL   HDL 66 >42 mg/dL   Total CHOL/HDL Ratio 2.2 RATIO   VLDL 20 0 - 40 mg/dL   LDL Cholesterol 57 0 - 99 mg/dL    Comment:        Total Cholesterol/HDL:CHD Risk Coronary Heart Disease Risk Table                     Men   Women  1/2 Average Risk   3.4   3.3  Average Risk       5.0   4.4  2 X Average Risk   9.6   7.1  3 X Average Risk  23.4   11.0        Use the calculated Patient Ratio above and the CHD Risk Table to determine the patient's CHD Risk.        ATP III CLASSIFICATION (LDL):  <100     mg/dL   Optimal  595-638  mg/dL   Near or Above                    Optimal  130-159  mg/dL   Borderline  756-433  mg/dL   High  >295     mg/dL   Very High Performed at [facility], [address], [place], [place] [address]  TSH     Status: None   Collection Time: 10/25/22  6:09 PM  Result Value Ref Range   TSH 0.721 0.350 - 4.500 uIU/mL    Comment: Performed by a 3rd Generation assay with a functional sensitivity of <=0.01 uIU/mL. Performed at [facility], [address], [place], [place] [address]     Blood Alcohol level:  Lab Results  Component Value Date   ETH <10 10/23/2022    Metabolic Disorder Labs: Lab Results  Component Value Date   HGBA1C 5.1 04/20/2020   MPG 100 04/20/2020   No results found for: "PROLACTIN" Lab Results  Component  Value Date   CHOL 143 10/25/2022   TRIG 100 10/25/2022   HDL 66 10/25/2022   CHOLHDL 2.2 10/25/2022   VLDL 20 10/25/2022   LDLCALC 57 10/25/2022   LDLCALC 56 01/06/2017    Physical Findings: AIMS:  , ,  ,  ,    CIWA:    COWS:     Musculoskeletal: Strength & Muscle Tone: within normal limits Gait & Station: normal Patient leans: N/A  Psychiatric Specialty Exam:  Presentation  General Appearance:  Appropriate for Environment; Casual; Fairly Groomed  Eye Contact: Good  Speech: Clear and Coherent  Speech Volume: Normal  Handedness: Right  Mood and Affect  Mood: Anxious  Affect: Congruent  Thought Process  Thought Processes: Coherent; Goal Directed  Descriptions of Associations:Intact  Orientation:Full (Time, Place and Person)  Thought Content:Logical; WDL  History of Schizophrenia/Schizoaffective disorder:No  Duration of Psychotic Symptoms:No data recorded Hallucinations:Hallucinations: None  Ideas of Reference:None  Suicidal Thoughts:Suicidal Thoughts: No  Homicidal Thoughts:Homicidal Thoughts: No  Sensorium  Memory: Immediate Good; Recent Good; Remote Good  Judgment: Fair  Insight: Fair  Art therapist  Concentration: Good  Attention Span: Good  Recall: Good  Fund of Knowledge: Fair  Language: Good  Psychomotor Activity  Psychomotor Activity: Psychomotor Activity: Normal  Assets  Assets: Communication Skills; Desire for Improvement; Physical Health; Social Support  Sleep  Sleep: Sleep: Fair Number of Hours of Sleep: 4  Physical Exam: Physical Exam Vitals and nursing note reviewed.  Constitutional:      Appearance: Normal appearance.  HENT:     Head: Normocephalic and atraumatic.     Right Ear: External ear normal.     Left Ear: External ear normal.     Nose: Nose normal.     Mouth/Throat:     Mouth: Mucous membranes are moist.     Pharynx: Oropharynx is clear.  Eyes:     Extraocular Movements:  Extraocular movements intact.     Conjunctiva/sclera: Conjunctivae normal.     Pupils: Pupils are equal, round, and reactive to light.  Cardiovascular:     Rate and Rhythm: Normal rate.     Pulses: Normal pulses.  Pulmonary:     Effort: Pulmonary effort is normal.  Abdominal:     Palpations: Abdomen is soft.  Genitourinary:    Comments: Deferred Musculoskeletal:        General: Normal range of motion.     Cervical back: Normal range of motion and neck supple.  Skin:    General: Skin is warm.  Neurological:     General: No focal deficit present.     Mental Status: She is alert and oriented to person, place, and time.  Psychiatric:        Mood and Affect: Mood normal.        Behavior: Behavior normal.  Thought Content: Thought content normal.    Review of Systems  Constitutional: Negative.   HENT: Negative.    Eyes: Negative.   Respiratory: Negative.    Cardiovascular: Negative.   Gastrointestinal: Negative.   Genitourinary: Negative.   Musculoskeletal: Negative.   Skin: Negative.   Neurological: Negative.   Endo/Heme/Allergies:                Reaction Severity Reaction Type Noted       Allergies    Molds & Smuts  Shortness Of Breath High Allergy 05/07/2013          Psychiatric/Behavioral:  Positive for suicidal ideas. The patient is nervous/anxious.    Blood pressure 125/88, pulse 71, temperature 98.2 F (36.8 C), temperature source Oral, resp. rate 18, height  (1.676 m), weight 56.2 kg, last menstrual period 10/02/2022, SpO2 100 %. Body mass index is 20.01 kg/m.   Treatment Plan Summary: Daily contact with patient to assess and evaluate symptoms and progress in treatment and Medication management  Observation Level/Precautions:  15 minute checks  Laboratory: Labs were independently reviewed on 10/25/2022  Psychotherapy: Unit group session  Medications:  See [facility]  Consultations:  To be determined  Discharge Concerns: Safety, medication  compliance, mood stability  Estimated LOS: 5 - 7 days  Other:  N/A    Physician Treatment Plan for Primary Diagnosis: MDD (major depressive disorder), recurrent severe, without psychosis (HCC) Long Term Goal(s): Improvement in symptoms so as ready for discharge   Short Term Goals: Ability to identify changes in lifestyle to reduce recurrence of condition will improve, Ability to verbalize feelings will improve, Ability to disclose and discuss suicidal ideas, Ability to demonstrate self-control will improve, Ability to identify and develop effective coping behaviors will improve, Ability to maintain clinical measurements within normal limits will improve, Compliance with prescribed medications will improve, and Ability to identify triggers associated with substance abuse/mental health issues will improve   Physician Treatment Plan for Secondary Diagnosis: Principal Problem:   MDD (major depressive disorder), recurrent severe, without psychosis (HCC) Active Problems:   Generalized anxiety disorder   Long Term Goal(s): Improvement in symptoms so as ready for discharge   Short Term Goals: Ability to identify changes in lifestyle to reduce recurrence of condition will improve, Ability to verbalize feelings will improve, Ability to disclose and discuss suicidal ideas, Ability to demonstrate self-control will improve, Ability to identify and develop effective coping behaviors will improve, Ability to maintain clinical measurements within normal limits will improve, Compliance with prescribed medications will improve, and Ability to identify triggers associated with substance abuse/mental health issues will improve   Plan:   Patient is admitted to [facility] due to attempted suicide via drug overdose by the ingestion of 3 tizanidine pills.  Patient denied a prior history of suicide attempts.  Prior to the assessment, patient was extremely tearful and vocalized not wanting to be admitted and wanting to go  home.  Provider explained to patient that she is involuntarily admitted to this facility and would not be able to be discharged today. After explaining her situation, patient appeared understanding.  Patient endorsed depression and anxiety but denied suicidal ideations.  Patient further denied psychosis at this time and did not appear to be responding to internal/external stimuli.  Patient was agreeable to start medication for the management of her major depressive disorder and generalized anxiety disorder.  Patient to be started on sertraline 25 mg for the next 2 days, followed by 50 mg daily.  Safety and Monitoring: Voluntary admission to inpatient psychiatric unit for safety, stabilization and treatment Daily contact with patient to assess and evaluate symptoms and progress in treatment Patient's case to be discussed in multi-disciplinary team meeting Observation Level : q15 minute checks Vital signs: q12 hours Precautions: safety   Diagnosis:  Principal Problem:   MDD (major depressive disorder), recurrent severe, without psychosis (HCC) Active Problems:   Generalized anxiety disorder   #Major depressive disorder, recurrent severe, without psychosis -Start sertraline 25 mg for 2 days, then increase to 50 mg starting Sunday (10/27/2022) for depression   #Generalized anxiety disorder -Start sertraline 25 mg for 2 days, then increase to 50 mg starting Sunday (10/27/2022) for anxiety   As needed medications: -Patient to continue taking Tylenol 650 mg every 6 hours as needed for mild pain -Patient to continue taking Maalox/Mylanta 30 mL every 4 hours as needed for indigestion -Patient to continue taking Milk of Magnesia 30 mL as needed for mild constipation -Increase hydroxyzine 50 mg 3 times daily as needed for anxiety -Continue Trazodone 50 mg at bedtime as needed for insomnia    Discharge Planning: Social work and case management to assist with discharge planning and identification of  hospital follow-up needs prior to discharge Estimated LOS: 5-7 days Discharge Concerns: Need to establish a safety plan; Medication compliance and effectiveness Discharge Goals: Return home with outpatient referrals for mental health follow-up including medication management/psychotherapy    I certify that inpatient services furnished can reasonably be expected to improve the patient's condition.     Cecilie Lowers, FNP 10/26/2022, 3:42 PM

## 2022-10-26 NOTE — [facility] Group Notes (Signed)
Psychoeducational Group Note    Date:  10/26/2022 Time: 9233-0076    Purpose of Group: . The group focus' on teaching patients on how to identify their needs and their Life Skills:  A group where two lists are made. What people need and what are things that we do that are unhealthy. The lists are developed by the patients and it is explained that we often do the actions that are not healthy to get our list of needs met.  Goal:: to develop the coping skills needed to get their needs met  Participation Level:  Active  Participation Quality:  Appropriate  Affect:  Appropriate  Cognitive:  Oriented  Insight: Improving  Engagement in Group:  Engaged  Modes of Intervention:  Activity, Discussion, Education, and Support  Additional Comments:  Rates energy at an 8/10. Participated fully in the group.  Paulino Rily

## 2022-10-26 NOTE — Progress Notes (Addendum)
D. Pt presented mildly anxious, flat affect, but brightened upon initial approach. Pt reported that she slept 'okay' last night, but just not long enough. Per pt's self inventory, pt rated her depression,hopelessness and anxiety a 2/0/4, respectively. Pt's stated goal was to "interact more." Pt has been visible in the milieu interacting appropriately with peers, and observed attending group. Pt currently denies SI/HI and AVH   A. Labs and vitals monitored. Pt given and educated on medications. Pt supported emotionally and encouraged to express concerns and ask questions.   R. Pt remains safe with 15 minute checks. Will continue POC.

## 2022-10-26 NOTE — Progress Notes (Signed)
   10/26/22 2313  Psych Admission Type (Psych Patients Only)  Admission Status Involuntary  Psychosocial Assessment  Patient Complaints None  Eye Contact Fair  Facial Expression Anxious  Affect Appropriate to circumstance  Speech Logical/coherent  Interaction Assertive  Motor Activity Other (Comment) (WDL)  Appearance/Hygiene Unremarkable  Behavior Characteristics Appropriate to situation  Mood Anxious  Thought Process  Coherency WDL  Content WDL  Delusions None reported or observed  Perception WDL  Hallucination None reported or observed  Judgment Impaired  Confusion None  Danger to Self  Current suicidal ideation? Denies  Danger to Others  Danger to Others None reported or observed

## 2022-10-26 NOTE — [facility] Group Notes (Signed)
.  Psychoeducational Group Note  Date: 09/25/22 Time: 0900-1000    Goal Setting   Purpose of Group: This group helps to provide patients with the steps of setting a goal that is specific, measurable, attainable, realistic and time specific. A discussion on how we keep ourselves stuck with negative self talk. Homework given for Patients to write 30 positive attributes about themselves.    Participation Level: did not attend  Tara Mckee A 

## 2022-10-26 NOTE — Group Note (Signed)
LCSW Group Therapy Note  10/26/2022     Type of Therapy and Topic:  Group Therapy: Anger and Coping Skills  Participation Level:  Active   Description of Group:   In this group, patients learned how to recognize the physical, cognitive, emotional, and behavioral responses they have to anger-provoking situations.  They identified how they usually or often react when angered, and learned how healthy and unhealthy coping skills work initially, but the unhealthy ones stop working.   They analyzed how their frequently-chosen coping skill is possibly beneficial and how it is possibly unhelpful.  The group discussed a variety of healthier coping skills that could help in resolving the actual issues, as well as how to go about planning for the the possibility of future similar situations.  Therapeutic Goals: Patients will identify one thing that makes them angry and how they feel emotionally and physically, what their thoughts are or tend to be in those situations, and what healthy or unhealthy coping mechanism they typically use Patients will identify how their coping technique works for them, as well as how it works against them. Patients will explore possible new behaviors to use in future anger situations. Patients will learn that anger itself is normal and cannot be eliminated, and that healthier coping skills can assist with resolving conflict rather than worsening situations.  Summary of Patient Progress:  The patient shared that she often is angered by always being the listener for others, then nobody ever being willing to listen to her.  She chooses to cope with these feelings by just being desperate because she does not think there is anything she actually can do about it.  The patient sees this coping skill as unhealthy but she did not express what she could do differently or is willing to do.  Therapeutic Modalities:   Cognitive Behavioral Therapy   Lynnell Chad  .

## 2022-10-27 DIAGNOSIS — F332 Major depressive disorder, recurrent severe without psychotic features: Principal | ICD-10-CM

## 2022-10-27 MED ORDER — WHITE PETROLATUM EX OINT
TOPICAL_OINTMENT | CUTANEOUS | Status: AC
  Filled 2022-10-27: qty 5

## 2022-10-27 NOTE — Group Note (Signed)
LCSW Group Therapy Note  10/27/2022      Type of Therapy and Topic:  Group Therapy: Gratitude   Description:   Group could not be held by social worker, but licensed RN did provide group.  A handout was given to each patient, with the following information:   Gratitude  "Acknowledging the good that you already have in your life is the foundation for all abundance." - Eckhart Tolle  " 'Enough' is a feast." - Buddhist Proverb  "Gratitude sweetens even the smallest moments."  "It is not joy that makes us grateful; It is gratitude that makes us joyful." - David Steindl-Rast    Put at least one response under each category of something for which you are grateful:  People:  Experiences:  Things:  Places:  Skills:  Other:  Add more responses as you get ideas from other people.   Therapeutic Modalities:   Activity  Nickey Canedo J Grossman-Orr, LCSW .  

## 2022-10-27 NOTE — Progress Notes (Addendum)
Patient has been visible in the milieu interacting well with peers and staff. Pt rated her depression,hopelessness and anxiety a 0/0/3, respectively.  Pt reported that she is tolerating medication well- denied SI/HI and A/VH.     10/27/22 1500  Psych Admission Type (Psych Patients Only)  Admission Status Involuntary  Psychosocial Assessment  Patient Complaints None  Eye Contact Fair  Facial Expression Anxious  Affect Appropriate to circumstance  Speech Logical/coherent  Interaction Assertive  Motor Activity Other (Comment) (level 3 observation)  Appearance/Hygiene Unremarkable  Behavior Characteristics Cooperative;Appropriate to situation  Mood Anxious  Thought Process  Coherency WDL  Content WDL  Delusions None reported or observed  Perception WDL  Hallucination None reported or observed  Judgment Impaired  Confusion None  Danger to Self  Current suicidal ideation? Denies  Danger to Others  Danger to Others None reported or observed

## 2022-10-27 NOTE — Progress Notes (Signed)
   10/27/22 2225  Psych Admission Type (Psych Patients Only)  Admission Status Involuntary  Psychosocial Assessment  Patient Complaints None  Eye Contact Fair  Facial Expression Flat  Affect Appropriate to circumstance  Speech Logical/coherent  Interaction Assertive  Motor Activity Other (Comment) (WDL)  Appearance/Hygiene Unremarkable  Behavior Characteristics Appropriate to situation  Mood Anxious  Thought Process  Coherency WDL  Content WDL  Delusions None reported or observed  Perception WDL  Hallucination None reported or observed  Judgment Impaired  Confusion None  Danger to Self  Current suicidal ideation? Denies  Danger to Others  Danger to Others None reported or observed

## 2022-10-27 NOTE — [facility] Group Notes (Signed)
Adult Psychoeducational Group  Date:  10/27/2022 Time:  1100-1200  Group Topic/Focus: Continuation of the group from Saturday. Looking at the lists that were created and talking about what needs to be done with the homework of 30 positives about themselves.                                     Talking about taking their power back and helping themselves to develop a positive self esteem.      Participation Quality:  Appropriate  Affect:  Appropriate  Cognitive:  Oriented  Insight: Improving  Engagement in Group:  Engaged  Modes of Intervention:  Activity, Discussion, Education, and Support  Additional Comments:  Rates her energy at an 8/10. Participated fully in the group.  Dione Housekeeper

## 2022-10-27 NOTE — Progress Notes (Signed)
[facility] MD Progress Note  11/26[facility]/2023 2:05 PM Tara Mckee  MRN:  [id number]  Subjective: Tara Mckee states," I feel my mood and my thinking process is better.  I am interacting more with other patients and feels stable enough to go home."  Reason for admission: Tara Mckee is a 21 year old female with no significant past psychiatric history who was involuntarily admitted to [facility] from [facility] due to suicide attempt via drug overdose by ingesting 3 pills of tizanidine.   Yesterday's psychiatric team recommendations: Increase sertraline 50 mg p.o. daily on 10/27/2022 for anxiety. Increase hydroxyzine 50 mg p.o. 3 times daily as needed for anxiety/sleep. Continue trazodone 50 mg p.o. at bedtime as needed for insomnia  Today's Assessment: Patient is seen face-to-face and examined on [address].  She appears calm and participating actively in the exams.  Alert and oriented x 4, speech clear, coherent with normal pattern and volume.  Presents with pleasant mood and congruent affect.  Presents with goal directed and coherent thought process.  Her plan is to resume school after the semester is over.  Thought content is logical.  Reports less depress mood and rates as 0/10, with 10 being the worst.  Reports less anxiety of 0 /10, with 10 being the worst.  Reports much improved sleep of 10 hours last night. Hydroxyzine 50 mg p.o. 3 times as needed for anxiety and sleep effective. Taking her prescribed medication without reports somatic symptoms.  Reports improved appetite and adequate energy.  Observed participating effectively in therapeutic milieu and group activities on the unit.  Labs and vital signs reviewed, and non critical.  Patient denies SI, HI, or AVH.  She further denies psychotic symptoms.  Does not appear to be responding to internal or external stimuli.  Able to contract for safety with this provider.  Report her stressors to be financial, school stress, and has taken  one semester off from school to rest.  Reports working at [employer] at [place], [place] and plans for discharge on Monday, 10/28/2022 so that she can resume her work and start saving for school.  Supportive therapy provided for ongoing stressors.   Principal Problem: MDD (major depressive disorder), recurrent severe, without psychosis (HCC)  Diagnosis: Principal Problem:   MDD (major depressive disorder), recurrent severe, without psychosis (HCC) Active Problems:   Generalized anxiety disorder  Total Time spent with patient: 30 minutes  Past Psychiatric History:  Previous Psychiatric diagnoses: Patient denies past history of psychiatric diagnosis Prior inpatient treatment: Patient denies Current/prior outpatient treatment: Patient denies Prior rehab history: Patient denies Psychotherapy: Patient denies History of Suicide: Patient denies prior history of suicide attempt History of Homicide: Patient denies Psychiatric medication history: Patient denies Psychiatric medication compliance history: Not applicable Neuromodulation history: Not applicable Current Psychiatrist: Patient denies Current Therapist: Patient denies  Past Medical History:  Past Medical History:  Diagnosis Date   Asthma    Eczema     Past Surgical History:  Procedure Laterality Date   ear tag     HERNIA REPAIR     Family History:  Family History  Problem Relation Age of Onset   Kidney Stones Mother    Seizures Mother    Migraines Mother    ADD / ADHD Brother    Family Psychiatric  History: Patient denies a past history of psychiatric illness within the family.  Psychiatric prescriptions: Patient denies any family members that utilize psychiatric prescriptions  Suicide Attempt: Patient denies a family history of suicide  attempts  Homicide Attempt: Patient denies a family history of homicide attempts  Family history of substance use: Patient denies a family history of substance abuse   Social  History:  Social History   Substance and Sexual Activity  Alcohol Use No   Comment: occas     Social History   Substance and Sexual Activity  Drug Use No    Social History   Socioeconomic History   Marital status: Single    Spouse name: Not on file   Number of children: Not on file   Years of education: Not on file   Highest education level: Not on file  Occupational History   Not on file  Tobacco Use   Smoking status: Never    Passive exposure: Yes   Smokeless tobacco: Never   Tobacco comments:    mom smokes  Vaping Use   Vaping Use: Every day  Substance and Sexual Activity   Alcohol use: No    Comment: occas   Drug use: No   Sexual activity: Yes    Birth control/protection: Patch  Other Topics Concern   Not on file  Social History Narrative   Dorothia lives with her brothers, mom and uncle.     Social Determinants of Health   Financial Resource Strain: Not on file  Food Insecurity: No Food Insecurity (10/24/2022)   Hunger Vital Sign    Worried About Running Out of Food in the Last Year: Never true    Ran Out of Food in the Last Year: Never true  Transportation Needs: No Transportation Needs (10/24/2022)   PRAPARE - Administrator, Civil Service (Medical): No    Lack of Transportation (Non-Medical): No  Physical Activity: Not on file  Stress: Not on file  Social Connections: Not on file   Additional Social History:    Sleep: Fair  Appetite:  Good  Current Medications: Current Facility-Administered Medications  Medication Dose Route Frequency Provider Last Rate Last Admin   acetaminophen (TYLENOL) tablet 650 mg  650 mg Oral Q6H PRN Lorissa Kishbaugh, Jesusita Oka, FNP       alum & mag hydroxide-simeth (MAALOX/MYLANTA) 200-200-20 MG/5ML suspension 30 mL  30 mL Oral Q4H PRN Anntonette Madewell, Jesusita Oka, FNP       hydrOXYzine (ATARAX) tablet 50 mg  50 mg Oral TID PRN Cecilie Lowers, FNP   50 mg at 10/26/22 2106   magnesium hydroxide (MILK OF MAGNESIA) suspension 30 mL  30 mL  Oral Daily PRN Harman Ferrin, Jesusita Oka, FNP       sertraline (ZOLOFT) tablet 50 mg  50 mg Oral Daily Nwoko, Uchenna E, PA   50 mg at 10/27/22 0806   traZODone (DESYREL) tablet 50 mg  50 mg Oral QHS PRN Cecilie Lowers, FNP   50 mg at 10/26/22 2106    Lab Results:  Results for orders placed or performed during the hospital encounter of 10/24/22 (from the past 48 hour(s))  CBC     Status: None   Collection Time: 10/25/22  6:09 PM  Result Value Ref Range   WBC 4.8 4.0 - 10.5 K/uL   RBC 4.48 3.87 - 5.11 MIL/uL   Hemoglobin 12.5 12.0 - 15.0 g/dL   HCT 32.6 71.2 - 45.8 %   MCV 88.6 80.0 - 100.0 fL   MCH 27.9 26.0 - 34.0 pg   MCHC 31.5 30.0 - 36.0 g/dL   RDW 09.9 83.3 - 82.5 %   Platelets 270 150 - 400 K/uL  nRBC 0.0 0.0 - 0.2 %    Comment: Performed at [facility], [address], [place], [place] [address]  Comprehensive metabolic panel     Status: Abnormal   Collection Time: 10/25/22  6:09 PM  Result Value Ref Range   Sodium 137 135 - 145 mmol/L   Potassium 4.5 3.5 - 5.1 mmol/L   Chloride 107 98 - 111 mmol/L   CO2 24 22 - 32 mmol/L   Glucose, Bld 97 70 - 99 mg/dL    Comment: Glucose reference range applies only to samples taken after fasting for at least 8 hours.   BUN 14 6 - 20 mg/dL   Creatinine, Ser 6.44 0.44 - 1.00 mg/dL   Calcium 8.8 (L) 8.9 - 10.3 mg/dL   Total Protein 7.0 6.5 - 8.1 g/dL   Albumin 3.9 3.5 - 5.0 g/dL   AST 14 (L) 15 - 41 U/L   ALT 11 0 - 44 U/L   Alkaline Phosphatase 51 38 - 126 U/L   Total Bilirubin 0.4 0.3 - 1.2 mg/dL   GFR, Estimated >03 >47 mL/min    Comment: (NOTE) Calculated using the CKD-EPI Creatinine Equation (2021)    Anion gap 6 5 - 15    Comment: Performed at [facility], [address], [place], [place] [address]  Lipid panel     Status: None   Collection Time: 10/25/22  6:09 PM  Result Value Ref Range   Cholesterol 143 0 - 200 mg/dL   Triglycerides 638 <756 mg/dL   HDL 66 >43 mg/dL   Total CHOL/HDL Ratio  2.2 RATIO   VLDL 20 0 - 40 mg/dL   LDL Cholesterol 57 0 - 99 mg/dL    Comment:        Total Cholesterol/HDL:CHD Risk Coronary Heart Disease Risk Table                     Men   Women  1/2 Average Risk   3.4   3.3  Average Risk       5.0   4.4  2 X Average Risk   9.6   7.1  3 X Average Risk  23.4   11.0        Use the calculated Patient Ratio above and the CHD Risk Table to determine the patient's CHD Risk.        ATP III CLASSIFICATION (LDL):  <100     mg/dL   Optimal  329-518  mg/dL   Near or Above                    Optimal  130-159  mg/dL   Borderline  841-660  mg/dL   High  >630     mg/dL   Very High Performed at [facility], [address] [place], [place] [address]   TSH     Status: None   Collection Time: 10/25/22  6:09 PM  Result Value Ref Range   TSH 0.721 0.350 - 4.500 uIU/mL    Comment: Performed by a 3rd Generation assay with a functional sensitivity of <=0.01 uIU/mL. Performed at [facility], [address], [place], [place] [address]     Blood Alcohol level:  Lab Results  Component Value Date   [facility] <10 10/23/2022    Metabolic Disorder Labs: Lab Results  Component Value Date   HGBA1C 5.1 04/20/2020   MPG 100 04/20/2020   No results  found for: "PROLACTIN" Lab Results  Component Value Date   CHOL 143 10/25/2022   TRIG 100 10/25/2022   HDL 66 10/25/2022   CHOLHDL 2.2 10/25/2022   VLDL 20 10/25/2022   LDLCALC 57 10/25/2022   LDLCALC 56 01/06/2017    Physical Findings: AIMS:  , ,  ,  ,    CIWA:    COWS:     Musculoskeletal: Strength & Muscle Tone: within normal limits Gait & Station: normal Patient leans: N/A  Psychiatric Specialty Exam:  Presentation  General Appearance:  Appropriate for Environment; Casual; Fairly Groomed  Eye Contact: Good  Speech: Clear and Coherent; Normal Rate  Speech Volume: Normal  Handedness: Right  Mood and Affect  Mood: Euthymic  Affect: Appropriate;  Congruent  Thought Process  Thought Processes: Coherent; Goal Directed  Descriptions of Associations:Intact  Orientation:Full (Time, Place and Person)  Thought Content:Logical; WDL  History of Schizophrenia/Schizoaffective disorder:No  Duration of Psychotic Symptoms:No data recorded Hallucinations:Hallucinations: None  Ideas of Reference:None  Suicidal Thoughts:Suicidal Thoughts: No  Homicidal Thoughts:Homicidal Thoughts: No  Sensorium  Memory: Immediate Good; Recent Good; Remote Good  Judgment: Good  Insight: Good  Executive Functions  Concentration: Good  Attention Span: Good  Recall: Good  Fund of Knowledge: Good  Language: Good  Psychomotor Activity  Psychomotor Activity: Psychomotor Activity: Normal  Assets  Assets: Communication Skills; Desire for Improvement; Housing; Physical Health; Social Support  Sleep  Sleep: Sleep: Good Number of Hours of Sleep: 10  Physical Exam: Physical Exam Vitals and nursing note reviewed.  Constitutional:      Appearance: Normal appearance.  HENT:     Head: Normocephalic and atraumatic.     Right Ear: External ear normal.     Left Ear: External ear normal.     Nose: Nose normal.     Mouth/Throat:     Mouth: Mucous membranes are moist.     Pharynx: Oropharynx is clear.  Eyes:     Extraocular Movements: Extraocular movements intact.     Conjunctiva/sclera: Conjunctivae normal.     Pupils: Pupils are equal, round, and reactive to light.  Cardiovascular:     Rate and Rhythm: Normal rate.     Pulses: Normal pulses.     Comments: Blood pressure 146/93, pulse 82.  Nursing staff to recheck vital signs Pulmonary:     Effort: Pulmonary effort is normal.  Abdominal:     Palpations: Abdomen is soft.  Genitourinary:    Comments: Deferred Musculoskeletal:        General: Normal range of motion.     Cervical back: Normal range of motion and neck supple.  Skin:    General: Skin is warm.  Neurological:      General: No focal deficit present.     Mental Status: She is alert and oriented to person, place, and time.  Psychiatric:        Mood and Affect: Mood normal.        Behavior: Behavior normal.        Thought Content: Thought content normal.    Review of Systems  Constitutional: Negative.   HENT: Negative.    Eyes: Negative.   Respiratory: Negative.    Cardiovascular: Negative.        Blood pressure 146/93, pulse 82.  Nursing staff to recheck vital signs.  Gastrointestinal: Negative.   Genitourinary: Negative.   Musculoskeletal: Negative.   Skin: Negative.   Neurological: Negative.   Endo/Heme/Allergies:  Reaction Severity Reaction Type Noted       Allergies    Molds & Smuts  Shortness Of Breath High Allergy 05/07/2013          Psychiatric/Behavioral:  Positive for suicidal ideas. The patient is nervous/anxious.    Blood pressure (!) 146/93, pulse 82, temperature 98.2 F (36.8 C), temperature source Oral, resp. rate 18, height 5\' 6"  (1.676 m), weight 56.2 kg, last menstrual period 10/02/2022, SpO2 99 %. Body mass index is 20.01 kg/m.   Treatment Plan Summary: Daily contact with patient to assess and evaluate symptoms and progress in treatment and Medication management  Observation Level/Precautions:  15 minute checks  Laboratory: Labs were independently reviewed on 10/25/2022  Psychotherapy: Unit group session  Medications:  See [facility]  Consultations:  To be determined  Discharge Concerns: Safety, medication compliance, mood stability  Estimated LOS: 5 - 7 days  Other:  N/A    Physician Treatment Plan for Primary Diagnosis: MDD (major depressive disorder), recurrent severe, without psychosis (HCC) Long Term Goal(s): Improvement in symptoms so as ready for discharge   Short Term Goals: Ability to identify changes in lifestyle to reduce recurrence of condition will improve, Ability to verbalize feelings will improve, Ability to disclose and discuss  suicidal ideas, Ability to demonstrate self-control will improve, Ability to identify and develop effective coping behaviors will improve, Ability to maintain clinical measurements within normal limits will improve, Compliance with prescribed medications will improve, and Ability to identify triggers associated with substance abuse/mental health issues will improve   Physician Treatment Plan for Secondary Diagnosis: Principal Problem:   MDD (major depressive disorder), recurrent severe, without psychosis (HCC) Active Problems:   Generalized anxiety disorder   Long Term Goal(s): Improvement in symptoms so as ready for discharge   Short Term Goals: Ability to identify changes in lifestyle to reduce recurrence of condition will improve, Ability to verbalize feelings will improve, Ability to disclose and discuss suicidal ideas, Ability to demonstrate self-control will improve, Ability to identify and develop effective coping behaviors will improve, Ability to maintain clinical measurements within normal limits will improve, Compliance with prescribed medications will improve, and Ability to identify triggers associated with substance abuse/mental health issues will improve   Plan:   Patient is admitted to [facility] due to attempted suicide via drug overdose by the ingestion of 3 tizanidine pills.  Patient denied a prior history of suicide attempts.  Prior to the assessment, patient was extremely tearful and vocalized not wanting to be admitted and wanting to go home.  Provider explained to patient that she is involuntarily admitted to this facility and would not be able to be discharged today. After explaining her situation, patient appeared understanding.  Patient endorsed depression and anxiety but denied suicidal ideations.  Patient further denied psychosis at this time and did not appear to be responding to internal/external stimuli.  Patient was agreeable to start medication for the management of her  major depressive disorder and generalized anxiety disorder.  Patient to be started on sertraline 25 mg for the next 2 days, followed by 50 mg daily.   Safety and Monitoring: Voluntary admission to inpatient psychiatric unit for safety, stabilization and treatment Daily contact with patient to assess and evaluate symptoms and progress in treatment Patient's case to be discussed in multi-disciplinary team meeting Observation Level : q15 minute checks Vital signs: q12 hours Precautions: safety   Diagnosis:  Principal Problem:   MDD (major depressive disorder), recurrent severe, without psychosis (HCC) Active Problems:  Generalized anxiety disorder   #Major depressive disorder, recurrent severe, without psychosis -Start sertraline 25 mg for 2 days, then increase to 50 mg starting Sunday (10/27/2022) for depression   #Generalized anxiety disorder -Start sertraline 25 mg for 2 days, then increase to 50 mg starting Sunday (10/27/2022) for anxiety   As needed medications: -Patient to continue taking Tylenol 650 mg every 6 hours as needed for mild pain -Patient to continue taking Maalox/Mylanta 30 mL every 4 hours as needed for indigestion -Patient to continue taking Milk of Magnesia 30 mL as needed for mild constipation -Increase hydroxyzine 50 mg 3 times daily as needed for anxiety -Continue Trazodone 50 mg at bedtime as needed for insomnia    Discharge Planning: Social work and case management to assist with discharge planning and identification of hospital follow-up needs prior to discharge Estimated LOS: 5-7 days Discharge Concerns: Need to establish a safety plan; Medication compliance and effectiveness Discharge Goals: Return home with outpatient referrals for mental health follow-up including medication management/psychotherapy    I certify that inpatient services furnished can reasonably be expected to improve the patient's condition.     Cecilie Lowers, FNP 10/27/2022, 2:05  PMPatient ID: Tara Mckee, female   DOB: 2001/05/03, 21 y.o.   MRN: [id number]

## 2022-10-27 NOTE — [facility] Group Notes (Signed)
Adult Psychoeducational Group Note Date:  10/27/2022 Time:  0900-1000 Group Topic/Focus: PROGRESSIVE RELAXATION. A group where deep breathing is taught and tensing and relaxation muscle groups is used. Imagery is used as well.  Pts are asked to imagine 3 pillars that hold them up when they are not able to hold themselves up and to share that with the group.   Participation Level:  Active  Participation Quality:  Appropriate  Affect:  Appropriate  Cognitive:  Approprate  Insight: Improving  Engagement in Group:  Engaged  Modes of Intervention:  deep breathing, Imagery. Discussion  Additional Comments:  Rates energy at a 7.5/10.  States her mother, her friends and a quiet place hold her up.   : Dione Housekeeper

## 2022-10-28 ENCOUNTER — Encounter (HOSPITAL_COMMUNITY): Payer: Self-pay

## 2022-10-28 LAB — HEMOGLOBIN A1C
Hgb A1c MFr Bld: 5.4 % (ref 4.8–5.6)
Mean Plasma Glucose: 108 mg/dL

## 2022-10-28 MED ORDER — HYDROXYZINE HCL 50 MG PO TABS: 50.0000 mg | ORAL_TABLET | Freq: Three times a day (TID) | ORAL | 0 refills | Status: AC | PRN

## 2022-10-28 MED ORDER — TRAZODONE HCL 50 MG PO TABS: 50.0000 mg | ORAL_TABLET | Freq: Every evening | ORAL | 0 refills | Status: AC | PRN

## 2022-10-28 MED ORDER — SERTRALINE HCL 50 MG PO TABS: 50.0000 mg | ORAL_TABLET | Freq: Every day | ORAL | 0 refills | Status: AC

## 2022-10-28 NOTE — Group Note (Signed)
Recreation Therapy Group Note   Group Topic:Stress Management  Group Date: 10/28/2022 Start Time: 0935 End Time: 0950 Facilitators: Teisha Trowbridge-McCall, LRT,CTRS Location: [address] Dayroom   Goal Area(s) Addresses:  Patient will identify positive stress management techniques. Patient will identify benefits of using stress management post d/c.  Group Description:  Guided Imagery.  LRT discussed the technique to patients and explained what to expect.  LRT then read a script for patients to envision their peaceful place.  Pt were to be as comfortable as possible and mentally envision the place that gives them the most relaxation and calm.    Affect/Mood: N/A   Participation Level: Did not attend    Clinical Observations/Individualized Feedback:     Plan: Continue to engage patient in RT group sessions 2-3x/week.   Jamen Loiseau-McCall, LRT,CTRS 10/28/2022 11:42 AM

## 2022-10-28 NOTE — [facility] Suicide Risk Assessment (Signed)
Suicide Risk Assessment  Discharge Assessment    [facility] Discharge Suicide Risk Assessment   Principal Problem: MDD (major depressive disorder), recurrent episode, moderate ([place]) Discharge Diagnoses: Principal Problem:   MDD (major depressive disorder), recurrent episode, moderate (HCC) Active Problems:   Generalized anxiety disorder   HPI:  Tara Mckee is a 21 year old, African [place] female with no significant past psychiatric history who was involuntarily admitted to [facility] from [place] ED due to suicide attempt.   HOSPITAL COURSE:  During the patient's hospitalization, patient had extensive initial psychiatric evaluation, and follow-up psychiatric evaluations every day.  Psychiatric diagnoses provided upon initial assessment: major depressive disorder and generalized anxiety disorder  Patient's psychiatric medications were adjusted on admission:  -Started Sertraline 25 mg daily -Started Hydroxyzine 25 mg 3 times daily as needed -Started Trazodone 50 mg at bedtime as needed  During the hospitalization, other adjustments were made to the patient's psychiatric medication regimen:  -Increased Sertraline to 50 mg daily -Increased Hydroxyzine 50 mg 3 times daily as needed -Continued Trazodone 50 mg at bedtime  Patient's care was discussed during the interdisciplinary team meeting every day during the hospitalization.  The patient denied having side effects to prescribed psychiatric medication.  Gradually, patient started adjusting to milieu. The patient was evaluated each day by a clinical provider to ascertain response to treatment. Improvement was noted by the patient's report of decreasing symptoms, improved sleep and appetite, affect, medication tolerance, behavior, and participation in unit programming.  Patient was asked each day to complete a self inventory noting mood, mental status, pain, new symptoms, anxiety and concerns.    Symptoms were  reported as significantly decreased or resolved completely by discharge.   On day of discharge, the patient reports that their mood is stable. The patient denied having suicidal thoughts for more than 48 hours prior to discharge.  Patient denies having homicidal thoughts.  Patient denies having auditory hallucinations.  Patient denies any visual hallucinations or other symptoms of psychosis. The patient was motivated to continue taking medication with a goal of continued improvement in mental health.   The patient reports their target psychiatric symptoms of major depressive disorder and generalized anxiety disorder responded well to the psychiatric medications, and the patient reports overall benefit other psychiatric hospitalization. Supportive psychotherapy was provided to the patient. The patient also participated in regular group therapy while hospitalized. Coping skills, problem solving as well as relaxation therapies were also part of the unit programming.  Labs were reviewed with the patient, and abnormal results were discussed with the patient.  The patient is able to verbalize their individual safety plan to this provider.  # It is recommended to the patient to continue psychiatric medications as prescribed, after discharge from the hospital.    # It is recommended to the patient to follow up with your outpatient psychiatric provider and PCP.  # It was discussed with the patient, the impact of alcohol, drugs, tobacco have been there overall psychiatric and medical wellbeing, and total abstinence from substance use was recommended the patient.ed.  # Prescriptions provided or sent directly to preferred pharmacy at discharge. Patient agreeable to plan. Given opportunity to ask questions. Appears to feel comfortable with discharge.    # In the event of worsening symptoms, the patient is instructed to call the crisis hotline, 911 and or go to the nearest ED for appropriate evaluation and  treatment of symptoms. To follow-up with primary care provider for other medical issues, concerns and or health  care needs  # Patient was discharged Home with a plan to follow up as noted below.    Safety plan was performed for the patient on 10/26/2022 by Lynnell Chad, LCSW. Provider was able to reach out to patient's mother today prior to discharge to discuss any concerns regarding patient's proposed discharged. Patient's mother expressed no concerns with the patient being discharged today.  Total Time spent with patient: 35 minutes  Musculoskeletal: Strength & Muscle Tone: within normal limits Gait & Station: normal Patient leans: N/A  Psychiatric Specialty Exam  Presentation  General Appearance:  Appropriate for Environment; Casual  Eye Contact: Good  Speech: Clear and Coherent; Normal Rate  Speech Volume: Normal  Handedness: Right   Mood and Affect  Mood: Euthymic  Duration of Depression Symptoms: Greater than two weeks  Affect: Appropriate; Congruent   Thought Process  Thought Processes: Coherent; Goal Directed  Descriptions of Associations:Intact  Orientation:Full (Time, Place and Person)  Thought Content:Logical; WDL  History of Schizophrenia/Schizoaffective disorder:No  Duration of Psychotic Symptoms:No data recorded Hallucinations:Hallucinations: None  Ideas of Reference:None  Suicidal Thoughts:Suicidal Thoughts: No  Homicidal Thoughts:Homicidal Thoughts: No   Sensorium  Memory: Immediate Good; Recent Good; Remote Good  Judgment: Good  Insight: Good   Executive Functions  Concentration: Good  Attention Span: Good  Recall: Good  Fund of Knowledge: Good  Language: Good   Psychomotor Activity  Psychomotor Activity: Psychomotor Activity: Normal   Assets  Assets: Communication Skills; Desire for Improvement; Physical Health; Social Support; Advertising copywriter; Housing; Financial  Resources/Insurance   Sleep  Sleep: Sleep: Good Number of Hours of Sleep: 10   Physical Exam: Physical Exam Constitutional:      Appearance: Normal appearance.  HENT:     Head: Normocephalic and atraumatic.     Nose: Nose normal.     Mouth/Throat:     Mouth: Mucous membranes are moist.  Eyes:     Extraocular Movements: Extraocular movements intact.     Pupils: Pupils are equal, round, and reactive to light.  Cardiovascular:     Rate and Rhythm: Normal rate and regular rhythm.  Pulmonary:     Effort: Pulmonary effort is normal.     Breath sounds: Normal breath sounds.  Abdominal:     General: Abdomen is flat.  Musculoskeletal:        General: Normal range of motion.     Cervical back: Normal range of motion and neck supple.  Skin:    General: Skin is warm and dry.  Neurological:     General: No focal deficit present.     Mental Status: She is alert and oriented to person, place, and time.  Psychiatric:        Attention and Perception: Attention and perception normal.        Mood and Affect: Mood and affect normal. Mood is not anxious or depressed.        Speech: Speech normal.        Behavior: Behavior normal. Behavior is cooperative.        Thought Content: Thought content normal. Thought content is not paranoid or delusional. Thought content does not include homicidal or suicidal ideation.        Cognition and Memory: Cognition and memory normal.        Judgment: Judgment normal.    Review of Systems  Constitutional: Negative.   HENT: Negative.    Eyes: Negative.   Respiratory: Negative.    Cardiovascular: Negative.   Gastrointestinal: Negative.   Musculoskeletal:  Negative.   Skin: Negative.   Neurological: Negative.   Psychiatric/Behavioral:  Negative for depression, hallucinations, substance abuse and suicidal ideas. The patient is not nervous/anxious and does not have insomnia.    Blood pressure 117/76, pulse 90, temperature 98.6 F (37 C), temperature  source Oral, resp. rate 18, height 5\' 6"  (1.676 m), weight 56.2 kg, last menstrual period 10/02/2022, SpO2 99 %. Body mass index is 20.01 kg/m.  Mental Status Per Nursing Assessment::   On Admission:  NA  Nursing information obtained from:  Patient Demographic factors:  Adolescent or young adult Loss Factors:  NA Historical Factors:  Impulsivity Risk Reduction Factors:  Positive social support, Employed  Suicide Risk:  Mild: There are no identifiable plans, no associated intent, mild dysphoria and related symptoms, good self-control (both objective and subjective assessment), few other risk factors, and identifiable protective factors, including available and accessible social support.   [place] Follow up.   Contact information: [address] [place] [address] [phone]         [facility] Follow up.   Why: You may go to this provider for therapy and medication management services. Contact information: [address] [place] [address] [phone]                 Plan Of Care/Follow-up recommendations:   Activity: As tolerated  Diet: Heart healthy  Other: -Follow-up with your outpatient psychiatric provider -instructions on appointment date, time, and address (location) are provided to you in discharge paperwork.   -Take your psychiatric medications as prescribed at discharge - instructions are provided to you in the discharge paperwork.    -Follow-up with outpatient primary care doctor and other specialists -for management of preventative medicine and chronic medical disease, including:   -Testing: Follow-up with outpatient provider for abnormal lab results:  none   -Recommend abstinence from alcohol, tobacco, and other illicit drug use at discharge.    -If your psychiatric symptoms recur, worsen, or if you have side effects to your psychiatric medications, call your  outpatient psychiatric provider, 911, [phone] or go to the nearest emergency department.   -If suicidal thoughts recur, call your outpatient psychiatric provider, 911, [phone] or go to the nearest emergency department.   Malachy Mood, PA 10/28/2022, 10:04 AM

## 2022-10-28 NOTE — [facility] IP Treatment Plan (Signed)
Interdisciplinary Treatment and Diagnostic Plan Update  10/28/2022 Time of Session: 9:50am Zakari Couchman MRN: [id number]  Principal Diagnosis: MDD (major depressive disorder), recurrent episode, moderate (HCC)  Secondary Diagnoses: Principal Problem:   MDD (major depressive disorder), recurrent episode, moderate (HCC) Active Problems:   Generalized anxiety disorder   Current Medications:  Current Facility-Administered Medications  Medication Dose Route Frequency Provider Last Rate Last Admin   acetaminophen (TYLENOL) tablet 650 mg  650 mg Oral Q6H PRN Ntuen, Jesusita Oka, FNP       alum & mag hydroxide-simeth (MAALOX/MYLANTA) 200-200-20 MG/5ML suspension 30 mL  30 mL Oral Q4H PRN Ntuen, Jesusita Oka, FNP       hydrOXYzine (ATARAX) tablet 50 mg  50 mg Oral TID PRN Cecilie Lowers, FNP   50 mg at 10/27/22 2128   magnesium hydroxide (MILK OF MAGNESIA) suspension 30 mL  30 mL Oral Daily PRN Ntuen, Jesusita Oka, FNP       sertraline (ZOLOFT) tablet 50 mg  50 mg Oral Daily Nwoko, Uchenna E, PA   50 mg at 10/28/22 0743   traZODone (DESYREL) tablet 50 mg  50 mg Oral QHS PRN Cecilie Lowers, FNP   50 mg at 10/27/22 2128   PTA Medications: Medications Prior to Admission  Medication Sig Dispense Refill Last Dose   norelgestromin-ethinyl estradiol Burr Medico) 150-35 MCG/24HR transdermal patch Place 1 patch onto the skin once a week. Apply one(1) patch on first week. Remove this patch after one(1) week and place a new patch. Continue for three(3) weeks. On the fourth (4th) week, remove patch and do not place a new one. After completing one(1) week without a patch, restart placing one(1) new patch per week, for three(3) weeks, and continue the above cycle. 6 patch 6     Patient Stressors: Educational concerns   Financial difficulties   Substance abuse    Patient Strengths: Manufacturing systems engineer  Physical Health  Supportive family/friends   Treatment Modalities: Medication Management, Group therapy, Case management,  1  to 1 session with clinician, Psychoeducation, Recreational therapy.   Physician Treatment Plan for Primary Diagnosis: MDD (major depressive disorder), recurrent episode, moderate (HCC) Long Term Goal(s): Improvement in symptoms so as ready for discharge   Short Term Goals: Ability to identify changes in lifestyle to reduce recurrence of condition will improve Ability to verbalize feelings will improve Ability to disclose and discuss suicidal ideas Ability to demonstrate self-control will improve Ability to identify and develop effective coping behaviors will improve Ability to maintain clinical measurements within normal limits will improve Compliance with prescribed medications will improve Ability to identify triggers associated with substance abuse/mental health issues will improve  Medication Management: Evaluate patient's response, side effects, and tolerance of medication regimen.  Therapeutic Interventions: 1 to 1 sessions, Unit Group sessions and Medication administration.  Evaluation of Outcomes: Progressing  Physician Treatment Plan for Secondary Diagnosis: Principal Problem:   MDD (major depressive disorder), recurrent episode, moderate (HCC) Active Problems:   Generalized anxiety disorder  Long Term Goal(s): Improvement in symptoms so as ready for discharge   Short Term Goals: Ability to identify changes in lifestyle to reduce recurrence of condition will improve Ability to verbalize feelings will improve Ability to disclose and discuss suicidal ideas Ability to demonstrate self-control will improve Ability to identify and develop effective coping behaviors will improve Ability to maintain clinical measurements within normal limits will improve Compliance with prescribed medications will improve Ability to identify triggers associated with substance abuse/mental health issues will improve  Medication Management: Evaluate patient's response, side effects, and  tolerance of medication regimen.  Therapeutic Interventions: 1 to 1 sessions, Unit Group sessions and Medication administration.  Evaluation of Outcomes: Progressing   RN Treatment Plan for Primary Diagnosis: MDD (major depressive disorder), recurrent episode, moderate (HCC) Long Term Goal(s): Knowledge of disease and therapeutic regimen to maintain health will improve  Short Term Goals: Ability to remain free from injury will improve, Ability to verbalize frustration and anger appropriately will improve, Ability to demonstrate self-control, Ability to participate in decision making will improve, Ability to verbalize feelings will improve, Ability to disclose and discuss suicidal ideas, Ability to identify and develop effective coping behaviors will improve, and Compliance with prescribed medications will improve  Medication Management: RN will administer medications as ordered by provider, will assess and evaluate patient's response and provide education to patient for prescribed medication. RN will report any adverse and/or side effects to prescribing provider.  Therapeutic Interventions: 1 on 1 counseling sessions, Psychoeducation, Medication administration, Evaluate responses to treatment, Monitor vital signs and CBGs as ordered, Perform/monitor CIWA, COWS, AIMS and Fall Risk screenings as ordered, Perform wound care treatments as ordered.  Evaluation of Outcomes: Progressing   LCSW Treatment Plan for Primary Diagnosis: MDD (major depressive disorder), recurrent episode, moderate (HCC) Long Term Goal(s): Safe transition to appropriate next level of care at discharge, Engage patient in therapeutic group addressing interpersonal concerns.  Short Term Goals: Engage patient in aftercare planning with referrals and resources, Increase social support, Increase ability to appropriately verbalize feelings, Increase emotional regulation, Facilitate acceptance of mental health diagnosis and concerns,  Facilitate patient progression through stages of change regarding substance use diagnoses and concerns, Identify triggers associated with mental health/substance abuse issues, and Increase skills for wellness and recovery  Therapeutic Interventions: Assess for all discharge needs, 1 to 1 time with Social worker, Explore available resources and support systems, Assess for adequacy in community support network, Educate family and significant other(s) on suicide prevention, Complete Psychosocial Assessment, Interpersonal group therapy.  Evaluation of Outcomes: Progressing   Progress in Treatment: Attending groups: Yes. Participating in groups: Yes. Taking medication as prescribed: Yes. Toleration medication: Yes. Family/Significant other contact made: Yes, individual(s) contacted:  mother Patient understands diagnosis: Yes. Discussing patient identified problems/goals with staff: Yes. Medical problems stabilized or resolved: Yes. Denies suicidal/homicidal ideation: Yes. Issues/concerns per patient self-inventory: No.  New problem(s) identified: No, Describe:  none reported   New Short Term/Long Term Goal(s):   medication stabilization, elimination of SI thoughts, development of comprehensive mental wellness plan.    Patient Goals: Patient states, "I want to learn how to not stress myself off as much."  Patient states that taking medications, and keeping her brain occupied will be helpful with maintaining stability  Discharge Plan or Barriers: Patient will be connected with med management and therapy  Reason for Continuation of Hospitalization: Anxiety Depression Medication stabilization  Estimated Length of Stay: 0 days , patient will be discharging  Last 3 [place] Suicide Severity Risk Score: Flowsheet Row [facility] (Current) [facility] 10/23/2022 in [facility] from 09/14/2022 in  [facility]  C-SSRS RISK CATEGORY No Risk High Risk No Risk       Last PHQ 2/9 Scores:    06/15/2022    6:24 PM 02/12/2021   10:19 PM 05/06/2019   11:28 AM  Depression screen PHQ 2/9  Decreased Interest 0  1  Down, Depressed, Hopeless  0 1 0  PHQ - 2 Score 0 1 1  Altered sleeping 3  0  Tired, decreased energy 0  1  Change in appetite 0  0  Feeling bad or failure about yourself  0  0  Trouble concentrating 0  0  Moving slowly or fidgety/restless 0  0  Suicidal thoughts 0  0  PHQ-9 Score 3  2  Difficult doing work/chores Not difficult at all  Not difficult at all    Scribe for Treatment Team: Beatris Si, LCSW 10/28/2022 11:38 AM

## 2022-10-28 NOTE — Discharge Summary (Signed)
Physician Discharge Summary Note  Patient:  Tara Mckee is an 21 y.o., female MRN:  [id number] DOB:  06-18-2001 Patient phone:  [phone] (home)  Patient address:   [address] [place] [address],  Total Time spent with patient: 35 minutes  Date of Admission:  10/24/2022 Date of Discharge: 10/28/2022  Reason for Admission:    Tara Mckee is a 21 year old female with no significant past psychiatric history who was involuntarily admitted to [facility] from [facility] due to suicide attempt via drug overdose by ingesting 3 pills of tizanidine.   Patient reports that her suicide attempt was triggered by financial stressors.  She reports that she attempted 3 days ago.  Patient reports that she took 3 pills of tizanidine which she got access to from her home.  After attempting, patient reports that she immediately called her mother, who in turn, contacted her brother who then called EMS.  Prior to her suicide attempt, patient reports that she has been having suicidal thoughts off and on but never considered attempting until 3 days ago.  Patient reports that she is unsure of how long she has been having suicidal thoughts.   In addition to her suicidal thoughts, patient endorses depression that she has been dealing with for years.  Patient her depression to financial stress.  Patient endorses the following depressive symptoms: insomnia, low mood, hopelessness, weight loss, decreased appetite, psychomotor retardation, and suicidal thoughts.  Patient depressive symptoms are worsened by lack of money.  She states that her depression is alleviated by friends and family.  Prior to her admission to this facility, patient reports that she was receiving on average 5 hours of sleep a night.  She reports that her sleep was characterized by waking up periodically.  Patient reports that she has no problems with falling asleep but states that she often finds herself waking  up at 2 in the morning.  Patient reports that she utilizes Benadryl and a sleep aid to help her sleep.  Patient endorses anxiety and rates her anxiety at 10 out of 10.  Patient reports that her anxiety is characterized by the following: agoraphobia, excessive worrying, social anxiety.  Patient endorses panic attacks and states that her last panic attack was last night.  She reports that her panic attack was triggered by being admitted to this facility.  Patient's panic attacks are characterized by the following symptoms: difficulty breathing, increased heart rate, lightheadedness, and feeling like there is an elephant on her chest.  Patient denies past history of manic episode.  Patient denies history of psychosis prior to her admission.   Patient denies a past history of hospitalization due to mental health.  Patient denies prior history of suicide attempt and denies history of psychiatric medication use.  Patient reports that she is sad and her biggest issue is wanting to go home.  Patient rates her current depression an 8 out of 10 with 10 being most severe.  She reports that her depression has worsened due to being admitted.  Patient also endorses anxiety and rates her anxiety a 10 out of 10.  Patient denies suicidal or homicidal ideations.  She further denies auditory or visual hallucinations and does not appear to be responding to internal/external stimuli.  Patient denies paranoia or delusional thoughts.  Patient reports that her sleep was not good last night, even with the use of trazodone.  She reports her appetite was good yesterday but states that she has not eaten  anything today.   Patient is casually dressed, sitting in bed, and alert and oriented x 4.  Patient is tearful during the assessment but is cooperative and able to answer all questions addressed to her.  Patient maintained good eye contact.  Patient's speech was clear, coherent, and with normal rate.  Patient's thought process is organized.   Patient's thought content is coherent.  Patient endorses depression and anxiety with congruent affect.  Patient denies suicidal ideations.  Patient does not appear to be responding to internal/external stimuli.  Principal Problem: MDD (major depressive disorder), recurrent episode, moderate (HCC) Discharge Diagnoses: Principal Problem:   MDD (major depressive disorder), recurrent episode, moderate (HCC) Active Problems:   Generalized anxiety disorder   Past Psychiatric History:  Major depressive disorder Generalized anxiety disorder  Past Medical History:  Past Medical History:  Diagnosis Date   Asthma    Eczema     Past Surgical History:  Procedure Laterality Date   ear tag     HERNIA REPAIR     Family History:  Family History  Problem Relation Age of Onset   Kidney Stones Mother    Seizures Mother    Migraines Mother    ADD / ADHD Brother    Family Psychiatric  History:  Patient denies a past history of psychiatric illness within the family  Social History:  Social History   Substance and Sexual Activity  Alcohol Use No   Comment: occas     Social History   Substance and Sexual Activity  Drug Use No    Social History   Socioeconomic History   Marital status: Single    Spouse name: Not on file   Number of children: Not on file   Years of education: Not on file   Highest education level: Not on file  Occupational History   Not on file  Tobacco Use   Smoking status: Never    Passive exposure: Yes   Smokeless tobacco: Never   Tobacco comments:    mom smokes  Vaping Use   Vaping Use: Every day  Substance and Sexual Activity   Alcohol use: No    Comment: occas   Drug use: No   Sexual activity: Yes    Birth control/protection: Patch  Other Topics Concern   Not on file  Social History Narrative   Yanice lives with her brothers, mom and uncle.     Social Determinants of Health   Financial Resource Strain: Not on file  Food Insecurity: No Food  Insecurity (10/24/2022)   Hunger Vital Sign    Worried About Running Out of Food in the Last Year: Never true    Ran Out of Food in the Last Year: Never true  Transportation Needs: No Transportation Needs (10/24/2022)   PRAPARE - Administrator, Civil Service (Medical): No    Lack of Transportation (Non-Medical): No  Physical Activity: Not on file  Stress: Not on file  Social Connections: Not on file    Hospital Course:    During the patient's hospitalization, patient had extensive initial psychiatric evaluation, and follow-up psychiatric evaluations every day.   Psychiatric diagnoses provided upon initial assessment: major depressive disorder and generalized anxiety disorder   Patient's psychiatric medications were adjusted on admission:  -Started Sertraline 25 mg daily -Started Hydroxyzine 25 mg 3 times daily as needed -Started Trazodone 50 mg at bedtime as needed   During the hospitalization, other adjustments were made to the patient's psychiatric medication regimen:  -Increased  Sertraline to 50 mg daily -Increased Hydroxyzine 50 mg 3 times daily as needed -Continued Trazodone 50 mg at bedtime   Patient's care was discussed during the interdisciplinary team meeting every day during the hospitalization.   The patient denied having side effects to prescribed psychiatric medication.   Gradually, patient started adjusting to milieu. The patient was evaluated each day by a clinical provider to ascertain response to treatment. Improvement was noted by the patient's report of decreasing symptoms, improved sleep and appetite, affect, medication tolerance, behavior, and participation in unit programming.  Patient was asked each day to complete a self inventory noting mood, mental status, pain, new symptoms, anxiety and concerns.     Symptoms were reported as significantly decreased or resolved completely by discharge.    On day of discharge, the patient reports that their  mood is stable. The patient denied having suicidal thoughts for more than 48 hours prior to discharge.  Patient denies having homicidal thoughts.  Patient denies having auditory hallucinations.  Patient denies any visual hallucinations or other symptoms of psychosis. The patient was motivated to continue taking medication with a goal of continued improvement in mental health.    The patient reports their target psychiatric symptoms of major depressive disorder and generalized anxiety disorder responded well to the psychiatric medications, and the patient reports overall benefit other psychiatric hospitalization. Supportive psychotherapy was provided to the patient. The patient also participated in regular group therapy while hospitalized. Coping skills, problem solving as well as relaxation therapies were also part of the unit programming.   Labs were reviewed with the patient, and abnormal results were discussed with the patient.   The patient is able to verbalize their individual safety plan to this provider.   # It is recommended to the patient to continue psychiatric medications as prescribed, after discharge from the hospital.     # It is recommended to the patient to follow up with your outpatient psychiatric provider and PCP.   # It was discussed with the patient, the impact of alcohol, drugs, tobacco have been there overall psychiatric and medical wellbeing, and total abstinence from substance use was recommended the patient.ed.   # Prescriptions provided or sent directly to preferred pharmacy at discharge. Patient agreeable to plan. Given opportunity to ask questions. Appears to feel comfortable with discharge.    # In the event of worsening symptoms, the patient is instructed to call the crisis hotline, 911 and or go to the nearest ED for appropriate evaluation and treatment of symptoms. To follow-up with primary care provider for other medical issues, concerns and or health care needs    # Patient was discharged Home with a plan to follow up as noted below.     Safety plan was performed for the patient on 10/26/2022 by Lynnell Chad, LCSW. Provider was able to reach out to patient's mother today prior to discharge to discuss any concerns regarding patient's proposed discharged. Patient's mother expressed no concerns with the patient being discharged today.  Physical Findings: AIMS:  , ,  ,  ,    CIWA:    COWS:     Musculoskeletal: Strength & Muscle Tone: within normal limits Gait & Station: normal Patient leans: N/A   Psychiatric Specialty Exam:  Presentation  General Appearance:  Appropriate for Environment; Casual  Eye Contact: Good  Speech: Clear and Coherent; Normal Rate  Speech Volume: Normal  Handedness: Right   Mood and Affect  Mood: Euthymic  Affect: Appropriate; Congruent  Thought Process  Thought Processes: Coherent; Goal Directed  Descriptions of Associations:Intact  Orientation:Full (Time, Place and Person)  Thought Content:Logical; WDL  History of Schizophrenia/Schizoaffective disorder:No  Duration of Psychotic Symptoms:No data recorded Hallucinations:Hallucinations: None  Ideas of Reference:None  Suicidal Thoughts:Suicidal Thoughts: No  Homicidal Thoughts:Homicidal Thoughts: No   Sensorium  Memory: Immediate Good; Recent Good; Remote Good  Judgment: Good  Insight: Good   Executive Functions  Concentration: Good  Attention Span: Good  Recall: Good  Fund of Knowledge: Good  Language: Good   Psychomotor Activity  Psychomotor Activity: Psychomotor Activity: Normal   Assets  Assets: Communication Skills; Desire for Improvement; Physical Health; Social Support; Advertising copywriterVocational/Educational; Housing; Financial Resources/Insurance   Sleep  Sleep: Sleep: Good Number of Hours of Sleep: 10    Physical Exam: Physical Exam Constitutional:      Appearance: Normal appearance.  HENT:      Head: Normocephalic and atraumatic.     Nose: Nose normal.     Mouth/Throat:     Mouth: Mucous membranes are moist.  Eyes:     Extraocular Movements: Extraocular movements intact.     Pupils: Pupils are equal, round, and reactive to light.  Cardiovascular:     Rate and Rhythm: Normal rate and regular rhythm.  Pulmonary:     Effort: Pulmonary effort is normal.     Breath sounds: Normal breath sounds.  Abdominal:     General: Abdomen is flat.  Musculoskeletal:        General: Normal range of motion.     Cervical back: Normal range of motion and neck supple.  Skin:    General: Skin is warm and dry.  Neurological:     General: No focal deficit present.     Mental Status: She is alert and oriented to person, place, and time.  Psychiatric:        Attention and Perception: Attention and perception normal. She does not perceive auditory or visual hallucinations.        Mood and Affect: Mood and affect normal. Mood is not anxious or depressed.        Speech: Speech normal.        Behavior: Behavior normal. Behavior is cooperative.        Thought Content: Thought content normal. Thought content is not paranoid or delusional. Thought content does not include homicidal or suicidal ideation.        Cognition and Memory: Cognition and memory normal.        Judgment: Judgment normal.    Review of Systems  Constitutional: Negative.   HENT: Negative.    Eyes: Negative.   Respiratory: Negative.    Cardiovascular: Negative.   Gastrointestinal: Negative.   Musculoskeletal: Negative.   Skin: Negative.   Neurological: Negative.   Psychiatric/Behavioral:  Negative for depression, hallucinations, substance abuse and suicidal ideas. The patient is not nervous/anxious and does not have insomnia.    Blood pressure 117/76, pulse 90, temperature 98.6 F (37 C), temperature source Oral, resp. rate 18, height 5\' 6"  (1.676 m), weight 56.2 kg, last menstrual period 10/02/2022, SpO2 99 %. Body mass  index is 20.01 kg/m.   Social History   Tobacco Use  Smoking Status Never   Passive exposure: Yes  Smokeless Tobacco Never  Tobacco Comments   mom smokes   Tobacco Cessation:  N/A, patient does not currently use tobacco products   Blood Alcohol level:  Lab Results  Component Value Date   ETH <10 10/23/2022    Metabolic Disorder  Labs:  Lab Results  Component Value Date   HGBA1C 5.4 10/25/2022   MPG 108 10/25/2022   MPG 100 04/20/2020   No results found for: "PROLACTIN" Lab Results  Component Value Date   CHOL 143 10/25/2022   TRIG 100 10/25/2022   HDL 66 10/25/2022   CHOLHDL 2.2 10/25/2022   VLDL 20 10/25/2022   LDLCALC 57 10/25/2022   LDLCALC 56 01/06/2017    See Psychiatric Specialty Exam and Suicide Risk Assessment completed by Attending Physician prior to discharge.  Discharge destination:  Home  Is patient on multiple antipsychotic therapies at discharge:  No   Has Patient had three or more failed trials of antipsychotic monotherapy by history:  No  Recommended Plan for Multiple Antipsychotic Therapies: NA  Discharge Instructions     Diet - low sodium heart healthy   Complete by: As directed    Increase activity slowly   Complete by: As directed       Allergies as of 10/28/2022       Reactions   Molds & Smuts Shortness Of Breath        Medication List     TAKE these medications      Indication  hydrOXYzine 50 MG tablet Commonly known as: ATARAX Take 1 tablet (50 mg total) by mouth 3 (three) times daily as needed for anxiety.  Indication: Feeling Anxious   norelgestromin-ethinyl estradiol 150-35 MCG/24HR transdermal patch Commonly known as: XULANE Place 1 patch onto the skin once a week. Apply one(1) patch on first week. Remove this patch after one(1) week and place a new patch. Continue for three(3) weeks. On the fourth (4th) week, remove patch and do not place a new one. After completing one(1) week without a patch, restart placing  one(1) new patch per week, for three(3) weeks, and continue the above cycle.  Indication: Birth Control Treatment   sertraline 50 MG tablet Commonly known as: ZOLOFT Take 1 tablet (50 mg total) by mouth daily. Start taking on: October 29, 2022  Indication: Generalized Anxiety Disorder, Major Depressive Disorder   traZODone 50 MG tablet Commonly known as: DESYREL Take 1 tablet (50 mg total) by mouth at bedtime as needed for sleep.  Indication: Trouble Sleeping        Follow-up Information     [facility], [facility]. Go on 11/01/2022.   Why: You have a hospital follow up appointment for therapy and medication management services on 11/01/22 at 8:30 am with Olevia Bowens.  This appointment will be held in person. Contact information: [address] [place] [address] [phone]                 Follow-up recommendations:    Activity: As tolerated  Diet: Heart healthy  Other: -Follow-up with your outpatient psychiatric provider -instructions on appointment date, time, and address (location) are provided to you in discharge paperwork.   -Take your psychiatric medications as prescribed at discharge - instructions are provided to you in the discharge paperwork.    -Follow-up with outpatient primary care doctor and other specialists -for management of preventative medicine and chronic medical disease, including:   -Testing: Follow-up with outpatient provider for abnormal lab results:  none   -Recommend abstinence from alcohol, tobacco, and other illicit drug use at discharge.    -If your psychiatric symptoms recur, worsen, or if you have side effects to your psychiatric medications, call your outpatient psychiatric provider, 911, [phone] or go to the nearest emergency department.   -  If suicidal thoughts recur, call your outpatient psychiatric provider, 911, [phone] or go to the nearest emergency department.   Signed: Meta Hatchet, PA 10/28/2022, 10:22  AM

## 2022-10-28 NOTE — Progress Notes (Signed)
  [facility] Adult Case Management Discharge Plan :  Will you be returning to the same living situation after discharge:  Yes,  Home  At discharge, do you have transportation home?: Yes,  Brother  Do you have the ability to pay for your medications: Yes,  Medicaid   Release of information consent forms completed and in the chart;  Patient's signature needed at discharge.  Patient to Follow up at:  Follow-up Information     [facility], [facility]. Go on 11/01/2022.   Why: You have a hospital follow up appointment for therapy and medication management services on 11/01/22 at 8:30 am with Olevia Bowens.  This appointment will be held in person. Contact information: [address] [place] [address] [phone]                 Next level of care provider has access to [facility] Link:no  Safety Planning and Suicide Prevention discussed: Yes,  with patient and mother      Has patient been referred to the Quitline?: N/A patient is not a smoker  Patient has been referred for addiction treatment: N/A  Aram Beecham, LCSWA 10/28/2022, 10:23 AM

## 2022-10-28 NOTE — Progress Notes (Signed)
Patient discharged from [facility] on 10/28/2022 at 11:30am. Patient denies SI, plan, and intention. Suicide safety plan completed, reviewed with this RN, given to the patient, and a copy in the chart. Patient denies HI/AVH upon discharge. Patient rates her depression a 0/10 and her anxiety a 5/10. Patient is alert, oriented, and cooperative. RN provided patient with discharge paperwork and reviewed information with patient. Patient expressed that she understood all of the discharge instructions. Pt was satisfied with belongings returned to her from the locker and at bedside. Discharged patient to [facility] waiting room. Pt's boyfriend awaiting patient in the [facility] waiting room.

## 2022-10-29 NOTE — [facility] Group Notes (Signed)
  Spiritual care group on grief and loss facilitated by chaplain Dyanne Carrel, [facility]  Group Goal:  Support / Education around grief and loss  Members engage in facilitated group support and psycho-social education.  Group Description:  Following introductions and group rules, group members engaged in facilitated group dialog and support around topic of loss, with particular support around experiences of loss in their lives. Group Identified types of loss (relationships / self / things) and identified patterns, circumstances, and changes that precipitate losses. Reflected on thoughts / feelings around loss, normalized grief responses, and recognized variety in grief experience. Group noted Worden's four tasks of grief in discussion.  Group drew on Adlerian / Rogerian, narrative, MI,  Patient Progress: Tara Mckee attended group prior to discharge and actively engaged and participated in group conversation.  [address] [facility] Pager, [phone]

## 2022-11-06 ENCOUNTER — Ambulatory Visit: Payer: Medicaid Other | Admitting: Student

## 2022-11-20 DIAGNOSIS — F332 Major depressive disorder, recurrent severe without psychotic features: Secondary | ICD-10-CM | POA: Diagnosis not present

## 2022-11-22 DIAGNOSIS — F332 Major depressive disorder, recurrent severe without psychotic features: Secondary | ICD-10-CM | POA: Diagnosis not present

## 2022-11-23 ENCOUNTER — Other Ambulatory Visit (HOSPITAL_COMMUNITY): Payer: Self-pay | Admitting: Physician Assistant

## 2022-12-03 DIAGNOSIS — F332 Major depressive disorder, recurrent severe without psychotic features: Secondary | ICD-10-CM | POA: Diagnosis not present

## 2022-12-23 ENCOUNTER — Ambulatory Visit: Payer: Self-pay | Admitting: Nurse Practitioner

## 2023-05-08 DIAGNOSIS — L309 Dermatitis, unspecified: Secondary | ICD-10-CM | POA: Diagnosis not present

## 2023-05-08 DIAGNOSIS — N369 Urethral disorder, unspecified: Secondary | ICD-10-CM | POA: Diagnosis not present

## 2023-05-08 DIAGNOSIS — Z13220 Encounter for screening for lipoid disorders: Secondary | ICD-10-CM | POA: Diagnosis not present

## 2023-05-08 DIAGNOSIS — Z13228 Encounter for screening for other metabolic disorders: Secondary | ICD-10-CM | POA: Diagnosis not present

## 2023-05-08 DIAGNOSIS — F329 Major depressive disorder, single episode, unspecified: Secondary | ICD-10-CM | POA: Diagnosis not present

## 2023-05-08 DIAGNOSIS — F419 Anxiety disorder, unspecified: Secondary | ICD-10-CM | POA: Diagnosis not present

## 2023-05-08 DIAGNOSIS — Z113 Encounter for screening for infections with a predominantly sexual mode of transmission: Secondary | ICD-10-CM | POA: Diagnosis not present

## 2023-07-12 DIAGNOSIS — H5213 Myopia, bilateral: Secondary | ICD-10-CM | POA: Diagnosis not present

## 2023-07-14 DIAGNOSIS — L309 Dermatitis, unspecified: Secondary | ICD-10-CM | POA: Diagnosis not present

## 2023-07-14 DIAGNOSIS — G4700 Insomnia, unspecified: Secondary | ICD-10-CM | POA: Diagnosis not present

## 2023-07-14 DIAGNOSIS — F329 Major depressive disorder, single episode, unspecified: Secondary | ICD-10-CM | POA: Diagnosis not present

## 2023-07-14 DIAGNOSIS — F419 Anxiety disorder, unspecified: Secondary | ICD-10-CM | POA: Diagnosis not present

## 2023-12-09 DIAGNOSIS — Z113 Encounter for screening for infections with a predominantly sexual mode of transmission: Secondary | ICD-10-CM | POA: Diagnosis not present

## 2023-12-09 DIAGNOSIS — R300 Dysuria: Secondary | ICD-10-CM | POA: Diagnosis not present

## 2023-12-09 DIAGNOSIS — B3731 Acute candidiasis of vulva and vagina: Secondary | ICD-10-CM | POA: Diagnosis not present

## 2023-12-21 DIAGNOSIS — B009 Herpesviral infection, unspecified: Secondary | ICD-10-CM | POA: Diagnosis not present

## 2023-12-21 DIAGNOSIS — A749 Chlamydial infection, unspecified: Secondary | ICD-10-CM | POA: Diagnosis not present

## 2023-12-21 DIAGNOSIS — N898 Other specified noninflammatory disorders of vagina: Secondary | ICD-10-CM | POA: Diagnosis not present

## 2023-12-22 DIAGNOSIS — F331 Major depressive disorder, recurrent, moderate: Secondary | ICD-10-CM | POA: Diagnosis not present

## 2023-12-22 DIAGNOSIS — N898 Other specified noninflammatory disorders of vagina: Secondary | ICD-10-CM | POA: Diagnosis not present

## 2023-12-22 DIAGNOSIS — R55 Syncope and collapse: Secondary | ICD-10-CM | POA: Diagnosis not present

## 2023-12-22 DIAGNOSIS — F29 Unspecified psychosis not due to a substance or known physiological condition: Secondary | ICD-10-CM | POA: Diagnosis not present

## 2023-12-22 DIAGNOSIS — F419 Anxiety disorder, unspecified: Secondary | ICD-10-CM | POA: Diagnosis not present

## 2023-12-22 DIAGNOSIS — A749 Chlamydial infection, unspecified: Secondary | ICD-10-CM | POA: Diagnosis not present

## 2023-12-22 NOTE — ED Provider Notes (Signed)
 ------------------------------------------------------------------------------- Attestation signed by Fairy Breed, DO at 12/23/23 [phone] I agree with the APP's history and physical. I personally discussed and approved the management plan and take responsibility for the patient management.   I independently reviewed and interpreted the nursing notes, vital signs, oxygen saturation and laboratory data as follows:  Vitals:   12/22/23 1623 12/22/23 1653 12/22/23 2235  BP: 125/72  132/82  BP Location:   Right Upper Arm  Patient Position:   Lying  Pulse: 88  76  Resp:  16 18  Temp: 98.1 F (36.7 C)    TempSrc: Oral    SpO2: 98%  99%    O2 Saturation Physician Interpretation: Stable, not hypoxic  Admission on 12/22/2023, Discharged on 12/22/2023  Component Date Value Ref Range Status  . WBC 12/22/2023 4.3  4.0 - 10.5 thou/mcL Final  . RBC 12/22/2023 4.89  3.93 - 5.22 million/mcL Final  . HGB 12/22/2023 13.5  11.2 - 15.7 gm/dL Final  . HCT 98/79/7974 43.5  34.1 - 44.9 % Final  . MCV 12/22/2023 89.0  79.4 - 94.8 fL Final  . MCH 12/22/2023 27.6  25.6 - 32.2 pg Final  . MCHC 12/22/2023 31.0 (L)  32.2 - 35.5 gm/dL Final  . Plt Ct 98/79/7974 234  150 - 400 thou/mcL Final  . RDW SD 12/22/2023 42.4  35.1 - 46.3 fL Final  . MPV 12/22/2023 10.4  9.4 - 12.4 fL Final  . NRBC% 12/22/2023 0.0  0.0 - 0.2 /100WBC Final  . Absolute NRBC Count 12/22/2023 0.00  0.00 - 0.01 thou/mcL Final  . NEUTROPHIL % 12/22/2023 50.3  % Final  . LYMPHOCYTE % 12/22/2023 39.9  % Final  . MONOCYTE % 12/22/2023 8.4  % Final  . Eosinophil % 12/22/2023 0.7  % Final  . BASOPHIL % 12/22/2023 0.5  % Final  . IG% 12/22/2023 0.2  % Final  . ABSOLUTE NEUTROPHIL COUNT 12/22/2023 2.17  1.50 - 7.50 thou/mcL Final  . ABSOLUTE LYMPHOCYTE COUNT 12/22/2023 1.72  1.00 - 4.50 thou/mcL Final  . Absolute Monocyte Count 12/22/2023 0.36  0.10 - 0.80 thou/mcL Final  . Absolute Eosinophil Count 12/22/2023 0.03  0.00 - 0.50 thou/mcL Final  .  Absolute Basophil Count 12/22/2023 0.02  0.00 - 0.20 thou/mcL Final  . Absolute Immature Granulocyte Count 12/22/2023 0.01  0.00 - 0.03 thou/mcL  Final  . Na 12/22/2023 138  136 - 146 mmol/L Final  . Potassium 12/22/2023 3.9  3.7 - 5.4 mmol/L Final  . Cl 12/22/2023 102  97 - 108 mmol/L Final  . CO2 12/22/2023 24  20 - 32 mmol/L Final  . AGAP 12/22/2023 12  7 - 16 mmol/L Final  . Glucose 12/22/2023 86  65 - 99 mg/dL Final  . BUN 98/79/7974 12  6 - 20 mg/dL Final  . Creatinine 98/79/7974 0.74  0.57 - 1.00 mg/dL Final  . Ca 98/79/7974 9.4  8.7 - 10.2 mg/dL Final  . ALK PHOS 98/79/7974 96  25 - 150 U/L Final  . T Bili 12/22/2023 0.4  0.0 - 1.2 mg/dL Final  . Total Protein 12/22/2023 8.1  6.0 - 8.5 gm/dL Final  . Alb 98/79/7974 4.4  3.5 - 5.5 gm/dL Final  . GLOBULIN 98/79/7974 3.7  1.5 - 4.5 gm/dL Final  . ALBUMIN/GLOBULIN RATIO 12/22/2023 1.2  1.1 - 2.5 Final  . BUN/CREAT RATIO 12/22/2023 16.2  11.0 - 26.0 Final  . ALT 12/22/2023 11  0 - 40 U/L Final  . AST 12/22/2023 19  0 - 40 U/L Final  . eGFR 12/22/2023 118  >=60 mL/min/1.47m2 Final  . Urine Color 12/22/2023 Yellow  Yellow  Final  . Urine Clarity 12/22/2023 Clear  Clear Final  . Urine Specific Gravity 12/22/2023 1.025  1.005 - 1.030 Final  . Urine pH 12/22/2023 6.5  5 to 9 Final  . Urine Protein - Dipstick 12/22/2023 20 (A)  Negative mg/dl Final  . Urine Glucose 12/22/2023 Negative  Negative mg/dL Final  . Urine Ketones 12/22/2023 40 (A)  Negative mg/dl Final  . Urine Bilirubin 12/22/2023 Negative  Negative mg/dL Final  . Urine Blood 98/79/7974 Negative  Negative mg/dL Final  . Urine Urobilinogen 12/22/2023 <2  <2 mg/dl Final  . Urine Nitrite 12/22/2023 Negative  Negative Final  . Urine Leukocyte Esterase 12/22/2023 250 (A)  Negative Leu/mcL Final  . Urine Squamous Epithelial Cells 12/22/2023 5 (H)  <=2 /HPF Final  . Urine WBC 12/22/2023 29 (H)  <=2 /HPF Final  . Urine RBC 12/22/2023 3 (H)  <=2 /HPF Final  . Urine Bacteria  12/22/2023 Rare (A)  None Seen /HPF Final  . Urine Mucous 12/22/2023 Few (A)  None /HPF Final  . UA Microscopic 12/22/2023 Yes Micro (A)  No Micro Final  . U BETA HCG QUAL 12/22/2023 Negative  Negative Final  . Ur PH DOA Scr 12/22/2023 6.5  4.5 - 9.0 Final  . Amphet Scr 12/22/2023 Negative  Negative Final  . Barb Scr 12/22/2023 Negative  Negative Final  . Benzo Scr 12/22/2023 Negative  Negative Final  . Cannab Scr 12/22/2023 Negative  Negative Final  . Cocaine Scr 12/22/2023 Negative  Negative Final  . Opiates Scr 12/22/2023 Negative  Negative Final  . Meth Scr 12/22/2023 Negative  Negative Final  . Oxyco Scr 12/22/2023 Negative  Negative Final  . Fentanyl Scr 12/22/2023 Negative  Negative Final  . Buprenorphine Screen 12/22/2023 Negative  Negative Final     No results found.   ECG Results          ECG 12 lead (Final result)  Result time 12/23/23 00:53:00    Final result             Narrative:   Diagnosis Class Normal Acquisition Device MV360 Ventricular Rate 85 Atrial Rate 85 P-R Interval 138 QRS Duration 80 Q-T Interval 376 QTC Calculation(Bazett) 447 Calculated P Axis 75 Calculated R Axis 77 Calculated T Axis 50  Diagnosis Normal sinus rhythm No previous ECGs available Tara Mckee ([id number]) on 12/23/2023 12:52:54 AM certifies that he/she has reviewed the ECG tracing and confirms the independent  interpretation is correct.                        Medical Decision Making (MDM):  Patient seen for vaginal lesions Patient given follow-up instructions and return precautions  -------------------------------------------------------------------------------   [facility]  ED Provider Note  Tara Mckee 23 y.o. female DOB: 12/08/2000 MRN: [id number] Patient presents concerned about vaginal lesions, syncope. She has been very anxious and seen at urgent care 3 times for this. Today she had HSV testing, chlamydia/gonorrhea testing,  trichomonas, HIV and RPR all tested today still pending. Started on valtrex.   She used to take medicine for her anxiety daily. She has passed out today while hyperventilating.   ROS: see above  Exam: Awake and alert. NAD. Calm cooperative in triage ambulatory   Patient was evaluated by the provider in triage and a medical screening examination was initiated.  Based on brief HPI and physical exam documented below, further evaluation and treatment are required in the main Emergency Department. Patient placed in the external observation area until a treatment room comes available. Patient was advised of the importance of staying to complete the emergency medical evaluation and any needed therapeutic treatments.  Medical screening initiated and orders placed by Dena LITTIE Masters, PA-C. 12/22/2023 / 4:49 PM  History   Chief Complaint  Patient presents with  . Anxiety    Was seen at urgent care yesterday for vaginal pain advised that is was possible herpes since then has been having panic attacks causing her to pass out states that she used take anti anxiety medication but quit taking it, and did have prn medication but has stopped it as well, also states that the pain is getting worse in vagina/vulva area   HPI Patient is a 23 year old female presenting to the emergency department for concerns of vaginal lesions, syncope, anxiety.  Patient reports she has had pain and a rash/some lesions to her vaginal region for the last few days.  She initially felt the symptoms could be related to razor bumps after recent shaving episode however then developed some pain near buttock region exacerbated during showering.  [facility] where she was prescribed medication for possible herpes infection but not tested.  She had then visited another urgent care yesterday to undergo blood work but due to severe anxiety and a syncopal episode the blood work was deferred till today.  She then  visited again with urgent care today for which she had HIV, syphilis, HSV, and gonorrhea chlamydia testing obtained which is still pending.  She is feeling very anxious regarding recent events.  She has only had 1 sexual partner in the last year and reports her partner does not have any history of herpes and is currently asymptomatic.   No past medical history on file.  No past surgical history on file.  Social History   Substance and Sexual Activity  Alcohol Use None   Social History   Tobacco Use  Smoking Status Never  Smokeless Tobacco Never   E-Cigarettes  . Vaping Use    . Start Date    . Cartridges/Day    . Quit Date     Social History   Substance and Sexual Activity  Drug Use Not on file         No Known Allergies  Discharge Medication List as of 12/22/2023 10:28 PM     CONTINUE these medications which have NOT CHANGED   Details  valacyclovir (VALTREX) 1000 mg tablet Take one tablet (1,000 mg dose) by mouth 2 (two) times daily for 10 days., Starting Sun 12/21/2023, Until Wed 12/31/2023, Normal        Primary Survey  Primary Survey  Review of Systems   Review of Systems  Physical Exam   ED Triage Vitals  BP 12/22/23 1623 125/72  Heart Rate 12/22/23 1623 88  Resp 12/22/23 1653 16  SpO2 12/22/23 1623 98 %  Temp 12/22/23 1623 98.1 F (36.7 C)    Physical Exam  Nursing note and vitals reviewed. Constitutional: She appears well-developed and well-nourished. She is in good hygiene. She has good hygiene. She does not appear distressed, does not appear ill and no respiratory distress. Not diaphoretic. HENT:  Head: Normocephalic and atraumatic.  Nose: Nose normal.  Mouth/Throat: Moist mucous membrane. No oropharyngeal exudate.Voice normal.  Eyes: EOM are intact. Conjunctivae are normal.  Pupils are equal, round, and reactive to light.  Neck: Normal range of motion and voice normal. Neck supple.  Cardiovascular: Normal rate, regular rhythm, normal heart  sounds and intact distal pulses.  No audible murmur. No friction rub and gallop.  Pulmonary/Chest: No respiratory distress. Good air movement. Not tachypneic. Respiratory effort normal and breath sounds normal. No chest wall tenderness.  Abdominal: Soft. There is no abdominal tenderness. Abdomen not distended.  Genitourinary:    Genitourinary Comments: Painful scattered papules, few pustules, sores erupting around posterior vaginal area. No vesicles. No chancre. Seems to be around hair follicles. No purulent drainage.  No erythema.  No vaginal discharge bleeding.  RN Isaiah chaperone, patient also shielded with blanket throughout entire exam   Musculoskeletal: Normal range of motion. No obvious deformity noted to extremities.     Cervical back: Normal range of motion and neck supple. no edema.  Neurological: She is alert and oriented to person, place, and time. Moves all extremities equally. Gait normal. She has normal speech. Cranial nerves intact II through XII.  Skin: Skin is warm. Not diaphoretic. Skin is dry. No rash noted.  Psychiatric: Her behavior is normal. Judgment and thought content normal. She appears anxious.    ED Course   Lab results:   CBC AND DIFFERENTIAL - Abnormal      Result Value   WBC 4.3     RBC 4.89     HGB 13.5     HCT 43.5     MCV 89.0     MCH 27.6     MCHC 31.0 (*)    Plt Ct 234     RDW SD 42.4     MPV 10.4     NRBC% 0.0     Absolute NRBC Count 0.00     NEUTROPHIL % 50.3     LYMPHOCYTE % 39.9     MONOCYTE % 8.4     Eosinophil % 0.7     BASOPHIL % 0.5     IG% 0.2     ABSOLUTE NEUTROPHIL COUNT 2.17     ABSOLUTE LYMPHOCYTE COUNT 1.72     Absolute Monocyte Count 0.36     Absolute Eosinophil Count 0.03     Absolute Basophil Count 0.02     Absolute Immature Granulocyte Count 0.01     Narrative:    Smear Scanned   URINALYSIS W/MICRO REFLEX CULTURE - SYMPTOMATIC - Abnormal   Urine Color Yellow     Urine Clarity Clear     Urine Specific Gravity  1.025     Urine pH 6.5     Urine Protein - Dipstick 20 (*)    Urine Glucose Negative     Urine Ketones 40 (*)    Urine Bilirubin Negative     Urine Blood Negative     Urine Urobilinogen <2     Urine Nitrite Negative     Urine Leukocyte Esterase 250 (*)    Urine Squamous Epithelial Cells 5 (*)    Urine WBC 29 (*)    Urine RBC 3 (*)    Urine Bacteria Rare (*)    Urine Mucous Few (*)    UA Microscopic Yes Micro (*)   COMPREHENSIVE METABOLIC PANEL - Normal   Na 138     Potassium 3.9     Cl 102     CO2 24     AGAP 12     Glucose 86     BUN 12     Creatinine  0.74     Ca 9.4     ALK PHOS 96     T Bili 0.4     Total Protein 8.1     Alb 4.4     GLOBULIN 3.7     ALBUMIN/GLOBULIN RATIO 1.2     BUN/CREAT RATIO 16.2     ALT 11     AST 19     eGFR 118     Comment: Normal GFR (glomerular filtration rate) > 60 mL/min/1.73 meters squared, < 60 may include impaired kidney function. Calculation based on the Chronic Kidney Disease Epidemiology Collaboration (CK-EPI)equation refit without adjustment for race.  HCG, URINE, QUALITATIVE - Normal   U BETA HCG QUAL Negative    URINE DRUGS OF ABUSE SCRN - Normal   Ur PH DOA Scr 6.5     Amphet Scr Negative     Barb Scr Negative     Benzo Scr Negative     Cannab Scr Negative     Cocaine Scr Negative     Opiates Scr Negative     Meth Scr Negative     Oxyco Scr Negative     Fentanyl Scr Negative     Buprenorphine Screen Negative     Narrative:    Please Note Detection Levels Below:                            Amphetamines                    1000 ng/mL  Barbiturates                    200 ng/mL  Benzodiazepines                 200 ng/mL  Cannabinoids (Marijuana, THC)   50 ng/mL  Cocaine                         300 ng/mL  Opiates                         300 ng/mL  Methadone                       300 ng/mL  Oxycodone                        100 ng/mL  Fentanyl                          5 ng/mL  Buprenorphine                     5  ng/mL  This test is a screening test and results are only to be used for medical purposes.  If confirmation of positive results are needed, please order confirmation by GC/MS for each drug that needs confirmation.  Urine specimens are retained for 5 days.   CULTURE, URINE    Imaging: No data to display  ECG: ECG Results          ECG 12 lead (In process)  Result time 12/22/23 17:15:31    In process             Narrative:   Diagnosis Class Abnormal Acquisition Device MV360 Ventricular Rate 85 Atrial Rate 85 P-R Interval 138 QRS Duration  80 Q-T Interval 376 QTC Calculation(Bazett) 447 Calculated P Axis 75 Calculated R Axis 77 Calculated T Axis 50  Diagnosis Normal sinus rhythm Cannot rule out Anterior infarct , age undetermined Abnormal ECG No previous ECGs available                                                                                 Pre-Sedation Procedures    Medical Decision Making Patient is a 23 year old female presenting to the emergency department for concerns of vaginal lesions, syncope, anxiety.  She is currently taking Valtrex as prescribed by urgent care.  No history of HSV.  No confirmed contacts with current partner denies history of.  Urgent care today has tested for HIV, syphilis, HSV, and gonorrhea chlamydia testing obtained which is still pending.  She is currently taking Valtrex for concerns of HSV with testing pending.  On exam the differential is broad, also discussed possible folliculitis or cellulitis.  Given that patient has had these multiple tests obtained already today for STI I do not see a reason to repeat them as ours would still be pending as well.  She is aware of the importance of following up on these results from urgent care and review of prior provider note reads today we will plan to follow them closely as well.  I discussed continuing Valtrex as prescribed while awaiting results she  was covered with Rocephin and given a prescription of doxycycline .  In regards to episode of syncope yesterday likely vasovagal with concurrent anxiety and blood draw.  EKG obtained here shows normal sinus rhythm no acute arrhythmia.  Labs without acute anemia.  CMP with no acute metabolic derangement.  Urinalysis appears contaminated.  No flank pain or CVA tenderness.  Pregnancy negative.  Patient was seen here by behavioral health specialist for voluntary evaluation given her history of anxiety and recent panic attacks.  She denies SI or HI.  I do feel she is stable for continued outpatient treatment and after eval with BH she was given resources for outpatient follow-up and treatment.  Patient understands that at this time there is no evidence for a more malignant underlying process, but also understands that early in the process of an illness, an initial workup can be falsely reassuring. Routine discharge counseling was given to the patient and they understand that worsening, changing, or persistent symptoms should prompt an immediate call or follow up with their PCP or return to the ED for re-evaluation. Patient demonstrated a good understanding of the emergency department course, disposition/diagnosis, and follow-up plan and were in complete agreement.  All other questions were answered and concerns addressed.     Amount and/or Complexity of Data Reviewed Labs: ordered. ECG/medicine tests: ordered.  Risk Prescription drug management.         Provider Communication  Discharge Medication List as of 12/22/2023 10:28 PM     START taking these medications   Details  doxycycline  hyclate (VIBRAMYCIN ) 100 mg capsule Take one capsule (100 mg dose) by mouth 2 (two) times daily for 7 days., Starting Mon 12/22/2023, Until Mon 12/29/2023, Normal        Discharge Medication List as of 12/22/2023 10:28 PM  Discharge Medication List as of 12/22/2023 10:28 PM      Clinical  Impression Final diagnoses:  Anxiety  Vaginal lesion  Syncope, unspecified syncope type    ED Disposition     ED Disposition  Discharge   Condition  Stable   Comment  --                 Follow-up Information     Go to  [facility] Emergency Department.   Specialty: Emergency Medicine Comments: As needed, If symptoms worsen Contact information: [address] [place]  [phone]                 Electronically signed by:    Dena LITTIE Masters, PA-C 12/23/23 0021

## 2023-12-23 ENCOUNTER — Encounter (HOSPITAL_COMMUNITY): Payer: Self-pay

## 2023-12-23 ENCOUNTER — Other Ambulatory Visit: Payer: Self-pay

## 2023-12-23 ENCOUNTER — Emergency Department (HOSPITAL_COMMUNITY)
Admission: EM | Admit: 2023-12-23 | Discharge: 2023-12-23 | Disposition: A | Discharge status: Home / Self Care | Payer: Medicaid Other

## 2023-12-23 DIAGNOSIS — A6000 Herpesviral infection of urogenital system, unspecified: Secondary | ICD-10-CM | POA: Diagnosis not present

## 2023-12-23 DIAGNOSIS — R102 Pelvic and perineal pain: Secondary | ICD-10-CM | POA: Diagnosis present

## 2023-12-23 DIAGNOSIS — B009 Herpesviral infection, unspecified: Secondary | ICD-10-CM | POA: Diagnosis not present

## 2023-12-23 DIAGNOSIS — R55 Syncope and collapse: Secondary | ICD-10-CM | POA: Diagnosis not present

## 2023-12-23 DIAGNOSIS — N898 Other specified noninflammatory disorders of vagina: Secondary | ICD-10-CM | POA: Diagnosis not present

## 2023-12-23 DIAGNOSIS — F419 Anxiety disorder, unspecified: Secondary | ICD-10-CM | POA: Diagnosis not present

## 2023-12-23 DIAGNOSIS — F331 Major depressive disorder, recurrent, moderate: Secondary | ICD-10-CM | POA: Diagnosis not present

## 2023-12-23 NOTE — ED Triage Notes (Signed)
Pt reports diagnosed and treated for chlamydia at urgent care.  Pt states "[facility] told me I may have herpes but did no test. I am still having vaginal pain and vaginal discharge."

## 2023-12-23 NOTE — ED Provider Notes (Cosign Needed Addendum)
EMERGENCY DEPARTMENT AT [facility] Provider Note   CSN: [id number] Arrival date & time: 12/23/23  1354     History  Chief Complaint  Patient presents with   Vaginal Discharge    Tara Mckee is a 23 y.o. female.   Vaginal Discharge Pt reports 5 day Hx of painful, burning vaginal lesions that started near anus. Since then, they have continued to spread toward the vagina. Was seen by urgent care and Dx w/ chlamydia and herpes and needs to pick up doxycycline.  States that she was given acyclovir and has only taken 1 day.  However there is confusion as she believes this might be due to folliculitis as she recently shaved the area before engaging in intercourse. Endorses dysuria.   Patient has repeated episodes of anxiety and panic attacks since being told that she has herpes.  She passed out when she was being told this and was evaluated both in the urgent care in the hospital.  Has had a full STD workup and negative for gonorrhea, HIV, syphilis, trichomonas.  She is requesting specifically HSV labs at this time.  Denies any herpes contact, fever, cough, chills, malaise, abdominal pain, nausea, vomiting.     Home Medications Prior to Admission medications   Medication Sig Start Date End Date Taking? Authorizing Provider  hydrOXYzine (ATARAX) 50 MG tablet Take 1 tablet (50 mg total) by mouth 3 (three) times daily as needed for anxiety. 10/28/22   Nwoko, Tommas Olp, PA  norelgestromin-ethinyl estradiol [employer]) 150-35 MCG/24HR transdermal patch Place 1 patch onto the skin once a week. Apply one(1) patch on first week. Remove this patch after one(1) week and place a new patch. Continue for three(3) weeks. On the fourth (4th) week, remove patch and do not place a new one. After completing one(1) week without a patch, restart placing one(1) new patch per week, for three(3) weeks, and continue the above cycle. 10/07/22   Corlis Hove, NP  sertraline (ZOLOFT) 50 MG tablet  Take 1 tablet (50 mg total) by mouth daily. 10/29/22   Nwoko, Tommas Olp, PA  traZODone (DESYREL) 50 MG tablet Take 1 tablet (50 mg total) by mouth at bedtime as needed for sleep. 10/28/22   Meta Hatchet, PA      Allergies    Molds & smuts    Review of Systems   Review of Systems  Genitourinary:  Positive for vaginal discharge.  Skin:  Positive for rash.  All other systems reviewed and are negative.   Physical Exam Updated Vital Signs BP (!) 133/96 (BP Location: Left Arm)   Pulse (!) 105   Temp 98 F (36.7 C) (Oral)   Resp 20   Ht 5\' 6"  (1.676 m)   Wt 56 kg   SpO2 99%   BMI 19.93 kg/m  Physical Exam Vitals and nursing note reviewed. Exam conducted with a chaperone present.  Constitutional:      General: She is not in acute distress.    Appearance: Normal appearance. She is not ill-appearing.  HENT:     Head: Normocephalic and atraumatic.     Mouth/Throat:     Mouth: Mucous membranes are moist.     Pharynx: Oropharynx is clear. No oropharyngeal exudate or posterior oropharyngeal erythema.  Eyes:     General: No scleral icterus.       Right eye: No discharge.        Left eye: No discharge.     Extraocular Movements: Extraocular movements intact.  Conjunctiva/sclera: Conjunctivae normal.  Cardiovascular:     Rate and Rhythm: Normal rate and regular rhythm.     Pulses: Normal pulses.     Heart sounds: Normal heart sounds. No murmur heard.    No friction rub. No gallop.  Pulmonary:     Effort: Pulmonary effort is normal. No respiratory distress.     Breath sounds: Normal breath sounds.  Abdominal:     General: Abdomen is flat.     Palpations: Abdomen is soft.     Tenderness: There is no abdominal tenderness.  Genitourinary:    Exam position: Lithotomy position.     Pubic Area: Rash present.     Vagina: Vaginal discharge present. No tenderness or lesions.     Comments: Vesicular present around anus and vulva.  They were incredibly tender to the touch.  No  Inguinal lymphadenopathy noted Lymphadenopathy:     Lower Body: No right inguinal adenopathy. No left inguinal adenopathy.  Skin:    General: Skin is warm and dry.  Neurological:     General: No focal deficit present.     Mental Status: She is alert. Mental status is at baseline.  Psychiatric:        Mood and Affect: Mood normal.     ED Results / Procedures / Treatments   Labs (all labs ordered are listed, but only abnormal results are displayed) Labs Reviewed  HSV CULTURE AND TYPING    EKG None  Radiology No results found.  Procedures Procedures    Medications Ordered in ED Medications - No data to display  ED Course/ Medical Decision Making/ A&P   {                               Medical Decision Making Amount and/or Complexity of Data Reviewed Labs: ordered.   This patient is a 23 year old female who presents to the ED for concern of herpes.   Differential diagnoses prior to evaluation: The emergent differential diagnosis includes, but is not limited to, herpes, folliculitis, dermatitis, cellulitis,. This is not an exhaustive differential.   Past Medical History / Co-morbidities / Social History: Atopic dermatitis, nummular eczema, MDD, generalized anxiety disorder  Additional history: Chart reviewed. Pertinent results include: Patient was seen yesterday by urgent care where she was told that she has herpes and upon being told had a panic attack where she passed out.  Had a full STI workup and was positive for chlamydia.  Prescribed doxycycline and Valtrex.  Lab Tests/Imaging studies: I personally interpreted labs/imaging and the pertinent results include:   HSV pending.  Medications: No medications needed for this visit at this time.  I have reviewed the patients home medicines and have made adjustments as needed.   ED Course:  Patient is a 23 year old female presents the ED complaining of herpes.  Says that she has been told she is having herpes but  wanted testing to have it confirmed.  Patient had already also been diagnosed with chlamydia.  Patient already has acyclovir and doxycycline prescribed.  Has not yet taken a doxycycline.  However has been treated with Rocephin already.    On physical exam there were numerous painful vesicular lesions noted around anus and vulva.  None present in the vaginal canal at this time.  A HSV viral testing was done Due to patient's request.  This clinically appears to be HSV.  Alerted patient that she is to continue taking her already  prescribed the cycle variant and already prescribed doxycycline for treatment of both HSV and chlamydia.  She was also to abstain from any intercourse at this time until treatment completion and symptoms have been abated.  Patient vitals remained stable throughout the course of her time here.  She said that she did not feel she was a danger to herself.  Upon reevaluation, patient was able to speak clearly and be of sound mind.  I do not believe that she is a danger to herself at this time. I provided her with strict warnings that if those feelings ever do occur to be sure to come back to the [facility] or ER for immediate evaluation.  Mother that if she feel that her daughter is a danger to herself that she will have to be the one to fill out and the IVC paperwork.  Patient and mother both expressed agreement understanding of plan.    Disposition: After consideration of the diagnostic results and the patients response to treatment, I feel that patient benefit from treatment noted as above and discharge.   emergency department workup does not suggest an emergent condition requiring admission or immediate intervention beyond what has been performed at this time. The plan is: Take already prescribed Valtrex for HSV, doxycycline for chlamydia, abstain from any sexual activity, follow-up with PCP, return for any new or worsening symptoms. The patient is safe for discharge and has been instructed  to return immediately for worsening symptoms, change in symptoms or any other concerns.  Final Clinical Impression(s) / ED Diagnoses Final diagnoses:  Herpes    Rx / DC Orders ED Discharge Orders     None         Lunette Stands, PA-C 12/23/23 1802    Lunette Stands, PA-C 12/23/23 1807    Coral Spikes, DO 12/24/23 0017

## 2023-12-23 NOTE — Discharge Instructions (Addendum)
You were seen today for herpes.  This was a clinical diagnosis however a culture has been sent out.  They should reach back out to you within the next week.  In the meantime you should continue to take your acyclovir and your doxycycline for treatment.  No acyclovir or doxycycline has been prescribed at this visit due to already having previously been prescribed these medications.  Be sure to abstain from any sexual activity at this time. Area clean with soap and water.  Be sure to wash daily.  You can take Tylenol and ibuprofen for pain control.  Take Tylenol (acetominophen)  650mg  every 4-6 hours, as needed for pain or fever. Do not take more than 4,000 mg in a 24-hour period. As this may cause liver damage. While this is rare, if you begin to develop yellowing of the skin or eyes, stop taking and return to ER immediately.  Take Ibuprofen 400mg  every 4-6 hours for pain or fever, not exceeding 3,200 mg per day as more than 3,200mg  can cause Stomach irritation, dizziness, kidney issues with long-term use.  Otherwise, no other emergent pathology is present at this time.  However if you begin to develop any thoughts of wanting to harm yourself, abdominal pain, painful urination despite treatment, fevers, shortness of breath, return to the ED for further evaluation immediately. Behavioral health urgent care is another resource to consider for mental health emergencies.

## 2023-12-25 LAB — HSV CULTURE AND TYPING

## 2023-12-26 NOTE — Telephone Encounter (Addendum)
 Pt is calling back regarding the results and message below. States that she was also seen in ER in [place]  on the date of 12/23/23. Also state that she was just advise by the ER of being positive for HSV . Mentions that she has some additional questions and concerns for the RN or Provider. Date seen: 12/22/23 [phone] ok to lvm     Fyi:  Please notify patient herpes labs are negative for both HSV-1 and HSV-2.  Remind patient the discussion we had about potential for genital warts and she does need to follow-up with her gynecologist.

## 2024-06-06 DIAGNOSIS — L0591 Pilonidal cyst without abscess: Secondary | ICD-10-CM | POA: Diagnosis not present

## 2024-06-10 ENCOUNTER — Other Ambulatory Visit: Payer: Self-pay

## 2024-06-10 ENCOUNTER — Encounter (HOSPITAL_COMMUNITY): Payer: Self-pay

## 2024-06-10 ENCOUNTER — Emergency Department (HOSPITAL_COMMUNITY)
Admission: EM | Admit: 2024-06-10 | Discharge: 2024-06-10 | Disposition: A | Discharge status: Home / Self Care | Attending: Emergency Medicine | Admitting: Emergency Medicine

## 2024-06-10 DIAGNOSIS — L0231 Cutaneous abscess of buttock: Secondary | ICD-10-CM | POA: Insufficient documentation

## 2024-06-10 DIAGNOSIS — L0291 Cutaneous abscess, unspecified: Secondary | ICD-10-CM

## 2024-06-10 MED ORDER — SULFAMETHOXAZOLE-TRIMETHOPRIM 800-160 MG PO TABS: 1.0000 | ORAL_TABLET | Freq: Two times a day (BID) | ORAL | 0 refills | Status: AC

## 2024-06-10 MED ORDER — LORAZEPAM 1 MG PO TABS
1.0000 mg | ORAL_TABLET | Freq: Once | ORAL | Status: AC
  Administered 2024-06-10: 1 mg via ORAL
  Filled 2024-06-10: qty 1

## 2024-06-10 MED ORDER — LIDOCAINE-EPINEPHRINE (PF) 2 %-1:200000 IJ SOLN
20.0000 mL | Freq: Once | INTRAMUSCULAR | Status: AC
  Administered 2024-06-10: 20 mL
  Filled 2024-06-10: qty 20

## 2024-06-10 MED ORDER — SULFAMETHOXAZOLE-TRIMETHOPRIM 800-160 MG PO TABS
1.0000 | ORAL_TABLET | Freq: Once | ORAL | Status: AC
  Administered 2024-06-10: 1 via ORAL
  Filled 2024-06-10: qty 1

## 2024-06-10 NOTE — ED Provider Notes (Signed)
 [place] EMERGENCY DEPARTMENT AT [facility] Provider Note   CSN: [id number] Arrival date & time: 06/10/24  1422     Patient presents with: Abscess   Tara Mckee is a 23 y.o. female who presents to the ED today with considerable amount of pain to the gluteal cleft due to a abscess that has formed, has been present for several days, progressively worsening over the last 6 days.  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  She has been keeping a bandage on the area and states that there has been drainage from the site, primarily located on the left superior aspect of the gluteal cleft.  Review of previous visits shows extensive history of previous management for folliculitis as well as for chlamydia and herpes.    Abscess      Prior to Admission medications   Medication Sig Start Date End Date Taking? Authorizing Provider  sulfamethoxazole -trimethoprim  (BACTRIM  DS) 800-160 MG tablet Take 1 tablet by mouth 2 (two) times daily for 10 days. 06/10/24 06/20/24 Yes Myriam Dorn BROCKS, PA  hydrOXYzine  (ATARAX ) 50 MG tablet Take 1 tablet (50 mg total) by mouth 3 (three) times daily as needed for anxiety. 10/28/22   Nwoko, Uchenna E, PA  norelgestromin -ethinyl estradiol  (XULANE) 150-35 MCG/24HR transdermal patch Place 1 patch onto the skin once a week. Apply one(1) patch on first week. Remove this patch after one(1) week and place a new patch. Continue for three(3) weeks. On the fourth (4th) week, remove patch and do not place a new one. After completing one(1) week without a patch, restart placing one(1) new patch per week, for three(3) weeks, and continue the above cycle. 10/07/22   Forlan, Nicole, NP  sertraline  (ZOLOFT ) 50 MG tablet Take 1 tablet (50 mg total) by mouth daily. 10/29/22   Nwoko, Uchenna E, PA  traZODone  (DESYREL ) 50 MG tablet Take 1 tablet (50 mg total) by mouth at  bedtime as needed for sleep. 10/28/22   Nwoko, Uchenna E, PA    Allergies: Molds & smuts    Review of Systems  Skin:  Positive for wound.  All other systems reviewed and are negative.   Updated Vital Signs BP 128/82 (BP Location: Right Arm)   Pulse (!) 112   Temp 97.9 F (36.6 C) (Oral)   Resp 20   SpO2 99%   Physical Exam Vitals and nursing note reviewed.  Constitutional:      General: She is not in acute distress.    Appearance: Normal appearance.  HENT:     Head: Normocephalic and atraumatic.     Mouth/Throat:     Mouth: Mucous membranes are moist.     Pharynx: Oropharynx is clear.  Eyes:     Extraocular Movements: Extraocular movements intact.     Conjunctiva/sclera: Conjunctivae normal.     Pupils: Pupils are equal, round, and reactive to light.  Cardiovascular:     Rate and Rhythm: Normal rate and regular rhythm.     Pulses: Normal pulses.     Heart sounds: Normal heart sounds. No murmur heard.    No friction rub. No gallop.  Pulmonary:     Effort: Pulmonary effort is normal.     Breath sounds: Normal breath sounds.  Abdominal:     General: Abdomen is flat. Bowel sounds are normal.     Palpations: Abdomen is soft.  Musculoskeletal:  General: Normal range of motion.     Cervical back: Normal range of motion and neck supple.     Right lower leg: No edema.     Left lower leg: No edema.  Skin:    General: Skin is warm and dry.     Capillary Refill: Capillary refill takes less than 2 seconds.     Comments: There is an approximately 3 cm fluctuant tender mass to the left side of the gluteal cleft.  There is copious purulent drainage noted from the same.  Neurological:     General: No focal deficit present.     Mental Status: She is alert. Mental status is at baseline.  Psychiatric:        Attention and Perception: Attention and perception normal.        Mood and Affect: Affect normal. Mood is anxious.        Speech: Speech normal.        Behavior:  Behavior normal.     (all labs ordered are listed, but only abnormal results are displayed) Labs Reviewed - No data to display  EKG: None  Radiology: No results found.   .Incision and Drainage  Date/Time: 06/10/2024 4:57 PM  Performed by: Myriam Dorn BROCKS, PA Authorized by: Myriam Dorn BROCKS, PA   Consent:    Consent obtained:  Verbal   Consent given by:  Patient   Risks, benefits, and alternatives were discussed: yes     Risks discussed:  Bleeding, incomplete drainage, infection and pain   Alternatives discussed:  Delayed treatment, alternative treatment, observation, referral and no treatment Universal protocol:    Procedure explained and questions answered to patient or proxy's satisfaction: yes     Patient identity confirmed:  Verbally with patient, arm band and hospital-assigned identification number Location:    Type:  Abscess   Location:  Anogenital   Anogenital location:  Gluteal cleft Pre-procedure details:    Skin preparation:  Antiseptic wash and povidone-iodine Sedation:    Sedation type:  Anxiolysis Anesthesia:    Anesthesia method:  Local infiltration Procedure type:    Complexity:  Complex Procedure details:    Ultrasound guidance: no     Needle aspiration: no     Incision types:  Single straight   Incision depth:  Dermal   Wound management:  Probed and deloculated, irrigated with saline and extensive cleaning   Drainage:  Bloody and purulent   Drainage amount:  Copious   Wound treatment:  Wound left open   Packing materials:  1/4 in iodoform gauze   Amount 1/4 iodoform:  10 cm Post-procedure details:    Procedure completion:  Tolerated well, no immediate complications Comments:     Lorazepam  1 mg given for anxiolysis.    Medications Ordered in the ED  LORazepam  (ATIVAN ) tablet 1 mg (1 mg Oral Given 06/10/24 1527)  lidocaine -EPINEPHrine  (XYLOCAINE  W/EPI) 2 %-1:200000 (PF) injection 20 mL (20 mLs Infiltration Given by Other 06/10/24 1600)   sulfamethoxazole -trimethoprim  (BACTRIM  DS) 800-160 MG per tablet 1 tablet (1 tablet Oral Given 06/10/24 1527)                                    Medical Decision Making Risk Prescription drug management.   Medical Decision Making:   Tara Mckee is a 23 y.o. female who presented to the ED today with painful cyst on the gluteal cleft detailed above.  Complete initial physical exam performed, notably the patient  was alert and oriented, very anxious appearing.  Notable physical exam findings are of a fluctuant mass that is approximately 3 cm in diameter on the left side of the gluteal cleft..    Reviewed and confirmed nursing documentation for past medical history, family history, social history.    Initial Assessment:   With the patient's presentation of painful cyst, most likely diagnosis is subcutaneous abscess.  Differential does include an infected pilonidal cyst.   Initial Plan:  Provide patient with anxiety relief with lorazepam  oral tablet. Refer for incision and drainage of the affected wound. Based on the clinical presentation and the review of the history of present illness, will defer lab evaluation and imaging at this time. Objective evaluation as below reviewed   Reassessment and Plan:   After assessment of this patient, performed incision and drainage of the subcutaneous abscess as noted in the procedure note.  Lorazepam  1 mg orally was given for patient's severe anxiety regarding the procedure.  Antibiotic course with Bactrim  has been prescribed for wound prophylaxis, and referral to primary care for monitoring of her healing and progression was given.  She understands and agrees with this care plan, has no further questions at this time       Final diagnoses:  Abscess    ED Discharge Orders          Ordered    sulfamethoxazole -trimethoprim  (BACTRIM  DS) 800-160 MG tablet  2 times daily        06/10/24 1643               Myriam Dorn BROCKS,  [place] 06/10/24 1700    Towana Ozell BROCKS, MD 06/10/24 [phone]

## 2024-06-10 NOTE — ED Triage Notes (Signed)
 Patient presented to ER for reoccuring cyst to top of buttocks. Patient has had cyst drained twice but keeps coming back. Drainage noted.

## 2024-06-21 DIAGNOSIS — L0591 Pilonidal cyst without abscess: Secondary | ICD-10-CM | POA: Diagnosis not present

## 2024-06-21 DIAGNOSIS — B3731 Acute candidiasis of vulva and vagina: Secondary | ICD-10-CM | POA: Diagnosis not present

## 2024-06-24 DIAGNOSIS — L0591 Pilonidal cyst without abscess: Secondary | ICD-10-CM | POA: Diagnosis not present

## 2024-07-07 DIAGNOSIS — U071 COVID-19: Secondary | ICD-10-CM | POA: Diagnosis not present

## 2024-07-07 DIAGNOSIS — R051 Acute cough: Secondary | ICD-10-CM | POA: Diagnosis not present

## 2024-07-14 DIAGNOSIS — U071 COVID-19: Secondary | ICD-10-CM | POA: Diagnosis not present

## 2024-07-14 DIAGNOSIS — Z7689 Persons encountering health services in other specified circumstances: Secondary | ICD-10-CM | POA: Diagnosis not present

## 2024-09-29 DIAGNOSIS — N760 Acute vaginitis: Secondary | ICD-10-CM | POA: Diagnosis not present

## 2024-09-29 DIAGNOSIS — B9689 Other specified bacterial agents as the cause of diseases classified elsewhere: Secondary | ICD-10-CM | POA: Diagnosis not present

## 2024-09-29 DIAGNOSIS — N898 Other specified noninflammatory disorders of vagina: Secondary | ICD-10-CM | POA: Diagnosis not present

## 2024-10-04 DIAGNOSIS — B9789 Other viral agents as the cause of diseases classified elsewhere: Secondary | ICD-10-CM | POA: Diagnosis not present

## 2024-10-04 DIAGNOSIS — J069 Acute upper respiratory infection, unspecified: Secondary | ICD-10-CM | POA: Diagnosis not present

## 2024-10-04 DIAGNOSIS — R059 Cough, unspecified: Secondary | ICD-10-CM | POA: Diagnosis not present

## 2024-10-14 DIAGNOSIS — Z23 Encounter for immunization: Secondary | ICD-10-CM | POA: Diagnosis not present

## 2024-11-15 DIAGNOSIS — Z3202 Encounter for pregnancy test, result negative: Secondary | ICD-10-CM | POA: Diagnosis not present

## 2024-11-15 DIAGNOSIS — B9689 Other specified bacterial agents as the cause of diseases classified elsewhere: Secondary | ICD-10-CM | POA: Diagnosis not present

## 2024-11-15 DIAGNOSIS — N898 Other specified noninflammatory disorders of vagina: Secondary | ICD-10-CM | POA: Diagnosis not present

## 2024-11-15 DIAGNOSIS — N760 Acute vaginitis: Secondary | ICD-10-CM | POA: Diagnosis not present
